# Patient Record
Sex: Female | Born: 1965 | Hispanic: No | Marital: Married | Smoking: Never smoker
Health system: Southern US, Community
[De-identification: ages and names within clinical notes are randomized; demographics above are authoritative.]

## PROBLEM LIST (undated history)

## (undated) DIAGNOSIS — Z789 Other specified health status: Secondary | ICD-10-CM

## (undated) DIAGNOSIS — Z531 Procedure and treatment not carried out because of patient's decision for reasons of belief and group pressure: Secondary | ICD-10-CM

## (undated) DIAGNOSIS — M25561 Pain in right knee: Secondary | ICD-10-CM

## (undated) DIAGNOSIS — IMO0001 Reserved for inherently not codable concepts without codable children: Secondary | ICD-10-CM

## (undated) HISTORY — PX: APPENDECTOMY: SHX54

---

## 2004-03-21 ENCOUNTER — Other Ambulatory Visit: Admission: RE | Admit: 2004-03-21 | Discharge: 2004-03-21 | Payer: Self-pay | Admitting: Obstetrics and Gynecology

## 2004-03-22 ENCOUNTER — Other Ambulatory Visit: Admission: RE | Admit: 2004-03-22 | Discharge: 2004-03-22 | Payer: Self-pay | Admitting: Obstetrics and Gynecology

## 2004-07-29 ENCOUNTER — Other Ambulatory Visit: Admission: RE | Admit: 2004-07-29 | Discharge: 2004-07-29 | Payer: Self-pay | Admitting: Obstetrics and Gynecology

## 2004-10-17 ENCOUNTER — Inpatient Hospital Stay (HOSPITAL_COMMUNITY): Admission: AD | Admit: 2004-10-17 | Discharge: 2004-10-19 | Payer: Self-pay | Admitting: Obstetrics & Gynecology

## 2004-11-18 ENCOUNTER — Other Ambulatory Visit: Admission: RE | Admit: 2004-11-18 | Discharge: 2004-11-18 | Payer: Self-pay | Admitting: Obstetrics & Gynecology

## 2016-04-12 ENCOUNTER — Emergency Department (HOSPITAL_COMMUNITY): Payer: BLUE CROSS/BLUE SHIELD

## 2016-04-12 ENCOUNTER — Encounter (HOSPITAL_COMMUNITY): Payer: Self-pay

## 2016-04-12 ENCOUNTER — Emergency Department (HOSPITAL_COMMUNITY)
Admission: EM | Admit: 2016-04-12 | Discharge: 2016-04-12 | Disposition: A | Discharge status: Home / Self Care | Payer: BLUE CROSS/BLUE SHIELD | Attending: Physician Assistant | Admitting: Physician Assistant

## 2016-04-12 DIAGNOSIS — R079 Chest pain, unspecified: Secondary | ICD-10-CM

## 2016-04-12 DIAGNOSIS — W11XXXA Fall on and from ladder, initial encounter: Secondary | ICD-10-CM | POA: Insufficient documentation

## 2016-04-12 DIAGNOSIS — Y9389 Activity, other specified: Secondary | ICD-10-CM | POA: Diagnosis not present

## 2016-04-12 DIAGNOSIS — Y999 Unspecified external cause status: Secondary | ICD-10-CM | POA: Diagnosis not present

## 2016-04-12 DIAGNOSIS — Y929 Unspecified place or not applicable: Secondary | ICD-10-CM | POA: Insufficient documentation

## 2016-04-12 DIAGNOSIS — R0789 Other chest pain: Secondary | ICD-10-CM | POA: Diagnosis not present

## 2016-04-12 LAB — COMPREHENSIVE METABOLIC PANEL
ALBUMIN: 3.2 g/dL — AB (ref 3.5–5.0)
ALT: 24 U/L (ref 14–54)
ANION GAP: 10 (ref 5–15)
AST: 22 U/L (ref 15–41)
Alkaline Phosphatase: 78 U/L (ref 38–126)
BILIRUBIN TOTAL: 0.4 mg/dL (ref 0.3–1.2)
BUN: 7 mg/dL (ref 6–20)
CHLORIDE: 106 mmol/L (ref 101–111)
CO2: 24 mmol/L (ref 22–32)
Calcium: 9.3 mg/dL (ref 8.9–10.3)
Creatinine, Ser: 0.6 mg/dL (ref 0.44–1.00)
GFR calc Af Amer: >60 mL/min (ref >60)
GFR calc non Af Amer: >60 mL/min (ref >60)
GLUCOSE: 103 mg/dL — AB (ref 65–99)
POTASSIUM: 3.5 mmol/L (ref 3.5–5.1)
SODIUM: 140 mmol/L (ref 135–145)
TOTAL PROTEIN: 7.1 g/dL (ref 6.5–8.1)

## 2016-04-12 LAB — CBC WITH DIFFERENTIAL/PLATELET
BASOS ABS: 0.1 10*3/uL (ref 0.0–0.1)
Basophils Relative: 1 %
EOS PCT: 1 %
Eosinophils Absolute: 0 10*3/uL (ref 0.0–0.7)
HEMATOCRIT: 36.6 % (ref 36.0–46.0)
Hemoglobin: 12.2 g/dL (ref 12.0–15.0)
LYMPHS ABS: 1.8 10*3/uL (ref 0.7–4.0)
LYMPHS PCT: 30 %
MCH: 27.4 pg (ref 26.0–34.0)
MCHC: 33.3 g/dL (ref 30.0–36.0)
MCV: 82.2 fL (ref 78.0–100.0)
MONO ABS: 0.4 10*3/uL (ref 0.1–1.0)
Monocytes Relative: 7 %
NEUTROS ABS: 3.6 10*3/uL (ref 1.7–7.7)
Neutrophils Relative %: 61 %
Platelets: 259 10*3/uL (ref 150–400)
RBC: 4.45 MIL/uL (ref 3.87–5.11)
RDW: 14.1 % (ref 11.5–15.5)
WBC: 5.9 10*3/uL (ref 4.0–10.5)

## 2016-04-12 LAB — I-STAT TROPONIN, ED
TROPONIN I, POC: 0 ng/mL (ref 0.00–0.08)
Troponin i, poc: 0 ng/mL (ref 0.00–0.08)

## 2016-04-12 MED ORDER — ACETAMINOPHEN 325 MG PO TABS
650.0000 mg | ORAL_TABLET | Freq: Once | ORAL | Status: AC
  Administered 2016-04-12: 650 mg via ORAL
  Filled 2016-04-12: qty 2

## 2016-04-12 NOTE — Discharge Instructions (Signed)
We're happy to report that we do not think you're having a heart attack. Please follow-up with her primary care physician and her cardiologist as an outpatient. Please return with any concerns.

## 2016-04-12 NOTE — ED Triage Notes (Signed)
Patient complains of left anterior chest pain and right arm pain after attempting to catch spouse who fell off ladder yesterday. Pain to chest worse with movement and inspiration, pain to right arm with ROM. No deformity noted, NAD

## 2016-04-12 NOTE — ED Notes (Signed)
EDP at bedside  

## 2016-04-12 NOTE — ED Notes (Signed)
Patient transported to X-ray 

## 2016-04-12 NOTE — ED Provider Notes (Signed)
[facility]-EMERGENCY DEPT Provider Note   CSN: [id number] Arrival date & time: 04/12/16  0844     History   Chief Complaint Chief Complaint  Patient presents with  . Chest Pain    HPI Lori Mcbride is a 50 y.o. female.  The history is provided by the patient.  Chest Pain   This is a new problem. The current episode started yesterday. Episode frequency: once, resolved. The problem has been resolved. Associated with: right after her husbadn fell from a ladder. The quality of the pain is described as brief. The pain does not radiate. Exacerbated by: resolved. Associated symptoms include headaches and shortness of breath. She has tried rest for the symptoms. The treatment provided significant relief. There are no known risk factors.  Pertinent negatives for past medical history include no COPD and no CHF.    Patient's husband fell off a ladder and she was present at the time. He has suffered significant injuries at the time. When he fell she had chest pain did not radiate last a couple minutes and has now disappeared. No chest pain since yesterday.  History reviewed. No pertinent past medical history.  There are no active problems to display for this patient.   History reviewed. No pertinent surgical history.  OB History    No data available       Home Medications    Prior to Admission medications   Not on File    Family History No family history on file.  Social History Social History  Substance Use Topics  . Smoking status: Never Smoker  . Smokeless tobacco: Never Used  . Alcohol use Not on file     Allergies   Review of patient's allergies indicates no known allergies.   Review of Systems Review of Systems  Respiratory: Positive for shortness of breath.   Cardiovascular: Positive for chest pain.  Neurological: Positive for headaches.  All other systems reviewed and are negative.    Physical Exam Updated Vital Signs BP 138/84 (BP Location: Left Arm)    Pulse 85   Temp 98.4 F (36.9 C) (Oral)   Resp 18   Ht 5\' 4"  (1.626 m)   Wt 190 lb (86.2 kg)   SpO2 99%   BMI 32.61 kg/m   Physical Exam  Constitutional: She is oriented to person, place, and time. She appears well-developed and well-nourished.  HENT:  Head: Normocephalic and atraumatic.  Eyes: Pupils are equal, round, and reactive to light. Right eye exhibits no discharge.  Cardiovascular: Normal rate and regular rhythm.   No murmur heard. Pulmonary/Chest: Effort normal. No respiratory distress.  Abdominal: Soft.  Neurological: She is oriented to person, place, and time.  Skin: Skin is warm and dry. She is not diaphoretic.  Psychiatric: She has a normal mood and affect.  Nursing note and vitals reviewed.    ED Treatments / Results  Labs (all labs ordered are listed, but only abnormal results are displayed) Labs Reviewed  COMPREHENSIVE METABOLIC PANEL  CBC WITH DIFFERENTIAL/PLATELET  Rosezena Sensor, ED    EKG  EKG Interpretation  Date/Time:  Saturday April 12 2016 08:51:31 EDT Ventricular Rate:  83 PR Interval:    QRS Duration: 128 QT Interval:  430 QTC Calculation: 505 R Axis:   81 Text Interpretation:  Normal sinus rhythm Non-specific intra-ventricular conduction block Cannot rule out Anterior infarct , age undetermined Abnormal ECG Abnormal ekg Confirmed by Kandis Mannan ([id number]) on 04/12/2016 9:03:18 AM       Radiology No results  found.  Procedures Procedures (including critical care time)  Medications Ordered in ED Medications - No data to display   Initial Impression / Assessment and Plan / ED Course  I have reviewed the triage vital signs and the nursing notes.  Pertinent labs & imaging results that were available during my care of the patient were reviewed by me and considered in my medical decision making (see chart for details).  Clinical Course    Patient a very pleasant 50 year old Hispanic female presenting with chest pain.  Patient had chest pain that lasted a couple minutes and happened yesterday after traumatic event to her husband who fell off a ladder. Patient is not having chest pain since. Patient does have mild right right tenderness to her right wrist after catching her husband. Patient's husband is admitted upstairs with multiple fractures. Given the patient had chest pain yesterday. Patient's of having a tiny bit of chest pain now. Worse with palpitation. We'll do delta troponin. Patient had visit with cardiologist before.  Reportedly they felt that she was fine and did not require intervention. Her EKG shows interventricular delay with negative sgarbossa criteria.  Delta trop negative.  Return precautions expressed, she will follow with cards and PCP.   Patient is comfortable, ambulatory, and taking PO at time of discharge.  Patient expressed understanding about return precautions.    Final Clinical Impressions(s) / ED Diagnoses   Final diagnoses:  None    New Prescriptions New Prescriptions   No medications on file     Mourad Cwikla Randall AnLyn Lonetta Blassingame, MD 04/12/16 1534

## 2016-04-12 NOTE — ED Notes (Signed)
Pt. Wanting medication for headache 4/10. EDP made aware.

## 2017-03-12 DIAGNOSIS — Z1389 Encounter for screening for other disorder: Secondary | ICD-10-CM | POA: Diagnosis not present

## 2017-03-12 DIAGNOSIS — Z1231 Encounter for screening mammogram for malignant neoplasm of breast: Secondary | ICD-10-CM | POA: Diagnosis not present

## 2017-03-12 DIAGNOSIS — Z0000 Encounter for general adult medical examination without abnormal findings: Secondary | ICD-10-CM | POA: Diagnosis not present

## 2019-12-20 DIAGNOSIS — K922 Gastrointestinal hemorrhage, unspecified: Secondary | ICD-10-CM

## 2019-12-20 HISTORY — DX: Gastrointestinal hemorrhage, unspecified: K92.2

## 2020-01-11 ENCOUNTER — Observation Stay (HOSPITAL_COMMUNITY): Payer: 59 | Admitting: Certified Registered"

## 2020-01-11 ENCOUNTER — Encounter (HOSPITAL_COMMUNITY): Payer: Self-pay

## 2020-01-11 ENCOUNTER — Other Ambulatory Visit: Payer: Self-pay

## 2020-01-11 ENCOUNTER — Inpatient Hospital Stay (HOSPITAL_COMMUNITY)
Admission: EM | Admit: 2020-01-11 | Discharge: 2020-01-13 | DRG: 378 | Disposition: A | Discharge status: Home / Self Care | Payer: 59 | Attending: Internal Medicine | Admitting: Internal Medicine

## 2020-01-11 ENCOUNTER — Encounter (HOSPITAL_COMMUNITY)
Admission: EM | Disposition: A | Discharge status: Home / Self Care | Payer: Self-pay | Source: Home / Self Care | Attending: Internal Medicine

## 2020-01-11 DIAGNOSIS — R739 Hyperglycemia, unspecified: Secondary | ICD-10-CM | POA: Diagnosis present

## 2020-01-11 DIAGNOSIS — K254 Chronic or unspecified gastric ulcer with hemorrhage: Principal | ICD-10-CM | POA: Diagnosis present

## 2020-01-11 DIAGNOSIS — K3182 Dieulafoy lesion (hemorrhagic) of stomach and duodenum: Secondary | ICD-10-CM | POA: Diagnosis present

## 2020-01-11 DIAGNOSIS — K922 Gastrointestinal hemorrhage, unspecified: Secondary | ICD-10-CM | POA: Diagnosis not present

## 2020-01-11 DIAGNOSIS — Z531 Procedure and treatment not carried out because of patient's decision for reasons of belief and group pressure: Secondary | ICD-10-CM | POA: Diagnosis present

## 2020-01-11 DIAGNOSIS — Z20822 Contact with and (suspected) exposure to covid-19: Secondary | ICD-10-CM | POA: Diagnosis present

## 2020-01-11 DIAGNOSIS — D62 Acute posthemorrhagic anemia: Secondary | ICD-10-CM | POA: Diagnosis present

## 2020-01-11 HISTORY — DX: Pain in right knee: M25.561

## 2020-01-11 HISTORY — PX: HEMOSTASIS CLIP PLACEMENT: SHX6857

## 2020-01-11 HISTORY — DX: Other specified health status: Z78.9

## 2020-01-11 HISTORY — PX: ESOPHAGOGASTRODUODENOSCOPY: SHX5428

## 2020-01-11 HISTORY — DX: Reserved for inherently not codable concepts without codable children: IMO0001

## 2020-01-11 HISTORY — DX: Procedure and treatment not carried out because of patient's decision for reasons of belief and group pressure: Z53.1

## 2020-01-11 HISTORY — PX: HEMOSTASIS CONTROL: SHX6838

## 2020-01-11 LAB — I-STAT BETA HCG BLOOD, ED (MC, WL, AP ONLY): I-stat hCG, quantitative: <5 m[IU]/mL (ref <5)

## 2020-01-11 LAB — COMPREHENSIVE METABOLIC PANEL
ALT: 16 U/L (ref 0–44)
AST: 20 U/L (ref 15–41)
Albumin: 3 g/dL — ABNORMAL LOW (ref 3.5–5.0)
Alkaline Phosphatase: 53 U/L (ref 38–126)
Anion gap: 9 (ref 5–15)
BUN: 7 mg/dL (ref 6–20)
CO2: 21 mmol/L — ABNORMAL LOW (ref 22–32)
Calcium: 8.4 mg/dL — ABNORMAL LOW (ref 8.9–10.3)
Chloride: 109 mmol/L (ref 98–111)
Creatinine, Ser: 0.64 mg/dL (ref 0.44–1.00)
GFR calc Af Amer: >60 mL/min (ref >60)
GFR calc non Af Amer: >60 mL/min (ref >60)
Glucose, Bld: 123 mg/dL — ABNORMAL HIGH (ref 70–99)
Potassium: 3.4 mmol/L — ABNORMAL LOW (ref 3.5–5.1)
Sodium: 139 mmol/L (ref 135–145)
Total Bilirubin: 0.8 mg/dL (ref 0.3–1.2)
Total Protein: 5.4 g/dL — ABNORMAL LOW (ref 6.5–8.1)

## 2020-01-11 LAB — CBC
HCT: 19.8 % — ABNORMAL LOW (ref 36.0–46.0)
HCT: 17.6 % — ABNORMAL LOW (ref 36.0–46.0)
Hemoglobin: 6.4 g/dL — CL (ref 12.0–15.0)
Hemoglobin: 5.6 g/dL — CL (ref 12.0–15.0)
MCH: 28.4 pg (ref 26.0–34.0)
MCH: 27.6 pg (ref 26.0–34.0)
MCHC: 32.3 g/dL (ref 30.0–36.0)
MCHC: 31.8 g/dL (ref 30.0–36.0)
MCV: 88 fL (ref 80.0–100.0)
MCV: 86.7 fL (ref 80.0–100.0)
Platelets: 229 10*3/uL (ref 150–400)
Platelets: 211 10*3/uL (ref 150–400)
RBC: 2.25 MIL/uL — ABNORMAL LOW (ref 3.87–5.11)
RBC: 2.03 MIL/uL — ABNORMAL LOW (ref 3.87–5.11)
RDW: 15.1 % (ref 11.5–15.5)
RDW: 15.5 % (ref 11.5–15.5)
WBC: 8.9 10*3/uL (ref 4.0–10.5)
WBC: 7.5 10*3/uL (ref 4.0–10.5)
nRBC: 0 % (ref 0.0–0.2)
nRBC: 0.5 % — ABNORMAL HIGH (ref 0.0–0.2)

## 2020-01-11 LAB — PROTIME-INR
INR: 1.1 (ref 0.8–1.2)
Prothrombin Time: 13.6 seconds (ref 11.4–15.2)

## 2020-01-11 LAB — URINALYSIS, ROUTINE W REFLEX MICROSCOPIC
Bilirubin Urine: NEGATIVE
Glucose, UA: NEGATIVE mg/dL
Hgb urine dipstick: NEGATIVE
Ketones, ur: NEGATIVE mg/dL
Leukocytes,Ua: NEGATIVE
Nitrite: NEGATIVE
Protein, ur: NEGATIVE mg/dL
Specific Gravity, Urine: 1.01 (ref 1.005–1.030)
pH: 6 (ref 5.0–8.0)

## 2020-01-11 LAB — SARS CORONAVIRUS 2 BY RT PCR (HOSPITAL ORDER, PERFORMED IN ~~LOC~~ HOSPITAL LAB): SARS Coronavirus 2: NEGATIVE

## 2020-01-11 LAB — HIV ANTIBODY (ROUTINE TESTING W REFLEX): HIV Screen 4th Generation wRfx: NONREACTIVE

## 2020-01-11 SURGERY — EGD (ESOPHAGOGASTRODUODENOSCOPY)
Anesthesia: Monitor Anesthesia Care

## 2020-01-11 MED ORDER — PROPOFOL 500 MG/50ML IV EMUL
INTRAVENOUS | Status: DC | PRN
  Administered 2020-01-11: 100 ug/kg/min via INTRAVENOUS

## 2020-01-11 MED ORDER — SODIUM CHLORIDE 0.9 % IV SOLN: 80.0000 mg | Freq: Once | INTRAVENOUS | Status: DC

## 2020-01-11 MED ORDER — PANTOPRAZOLE SODIUM 40 MG IV SOLR
40.0000 mg | Freq: Once | INTRAVENOUS | Status: AC
  Administered 2020-01-11: 40 mg via INTRAVENOUS
  Filled 2020-01-11: qty 40

## 2020-01-11 MED ORDER — SODIUM CHLORIDE 0.9 % IV SOLN
8.0000 mg/h | INTRAVENOUS | Status: DC
  Administered 2020-01-11 – 2020-01-13 (×6): 8 mg/h via INTRAVENOUS
  Filled 2020-01-11 (×6): qty 80

## 2020-01-11 MED ORDER — ACETAMINOPHEN 325 MG PO TABS: 650.0000 mg | ORAL_TABLET | Freq: Four times a day (QID) | ORAL | Status: DC | PRN

## 2020-01-11 MED ORDER — SODIUM CHLORIDE 0.9 % IV SOLN
80.0000 mg | Freq: Once | INTRAVENOUS | Status: DC
  Filled 2020-01-11: qty 80

## 2020-01-11 MED ORDER — SODIUM CHLORIDE (PF) 0.9 % IJ SOLN
PREFILLED_SYRINGE | INTRAMUSCULAR | Status: DC | PRN
  Administered 2020-01-11: 3 mL

## 2020-01-11 MED ORDER — ACETAMINOPHEN 650 MG RE SUPP: 650.0000 mg | Freq: Four times a day (QID) | RECTAL | Status: DC | PRN

## 2020-01-11 MED ORDER — OCTREOTIDE LOAD VIA INFUSION
50.0000 ug | Freq: Once | INTRAVENOUS | Status: AC
  Administered 2020-01-11: 50 ug via INTRAVENOUS
  Filled 2020-01-11: qty 25

## 2020-01-11 MED ORDER — SODIUM CHLORIDE 0.9 % IV SOLN
50.0000 ug/h | INTRAVENOUS | Status: DC
  Administered 2020-01-11: 50 ug/h via INTRAVENOUS
  Filled 2020-01-11: qty 1

## 2020-01-11 MED ORDER — PANTOPRAZOLE SODIUM 40 MG IV SOLR
8.0000 mg/h | INTRAVENOUS | Status: DC
  Filled 2020-01-11: qty 80

## 2020-01-11 MED ORDER — LACTATED RINGERS IV SOLN: INTRAVENOUS | Status: DC

## 2020-01-11 MED ORDER — ONDANSETRON HCL 4 MG PO TABS: 4.0000 mg | ORAL_TABLET | Freq: Four times a day (QID) | ORAL | Status: DC | PRN

## 2020-01-11 MED ORDER — SODIUM CHLORIDE 0.9% FLUSH
3.0000 mL | Freq: Two times a day (BID) | INTRAVENOUS | Status: DC
  Administered 2020-01-12 – 2020-01-13 (×2): 3 mL via INTRAVENOUS

## 2020-01-11 MED ORDER — EPINEPHRINE 1 MG/10ML IJ SOSY
PREFILLED_SYRINGE | INTRAMUSCULAR | Status: AC
  Filled 2020-01-11: qty 10

## 2020-01-11 MED ORDER — LACTATED RINGERS IV BOLUS
500.0000 mL | Freq: Once | INTRAVENOUS | Status: AC
  Administered 2020-01-11: 500 mL via INTRAVENOUS

## 2020-01-11 MED ORDER — ONDANSETRON HCL 4 MG/2ML IJ SOLN: 4.0000 mg | Freq: Four times a day (QID) | INTRAMUSCULAR | Status: DC | PRN

## 2020-01-11 NOTE — Anesthesia Postprocedure Evaluation (Signed)
Anesthesia Post Note  Patient: Lori Mcbride  Procedure(s) Performed: ESOPHAGOGASTRODUODENOSCOPY (EGD) (N/A ) HEMOSTASIS CLIP PLACEMENT HEMOSTASIS CONTROL     Patient location during evaluation: PACU Anesthesia Type: MAC Level of consciousness: awake and alert, awake and oriented Pain management: pain level controlled Vital Signs Assessment: post-procedure vital signs reviewed and stable Respiratory status: spontaneous breathing, nonlabored ventilation and respiratory function stable Cardiovascular status: stable and blood pressure returned to baseline Postop Assessment: no apparent nausea or vomiting Anesthetic complications: no   No complications documented.  Last Vitals:  Vitals:   01/11/20 1625 01/11/20 1634  BP: (!) 147/64 132/68  Pulse: (!) 109 100  Resp: (!) 27 (!) 23  Temp:    SpO2: 97% 96%    Last Pain:  Vitals:   01/11/20 1634  TempSrc:   PainSc: 0-No pain                 Catalina Gravel

## 2020-01-11 NOTE — ED Provider Notes (Signed)
Medical screening examination/treatment/procedure(s) were conducted as a shared visit with non-physician practitioner(s) and myself.  I personally evaluated the patient during the encounter.    Patient has no prior history of GI bleed.  She reports she started having some dark looking stool for couple of days.  Early in the morning hours she had gotten out of bed and passed out then vomited bright red blood.  Patient denies alcohol use no frequent use of NSAIDs, no history of liver disease.  Patient considers self otherwise healthy.  Patient is Jehovah's Witness and refuses blood transfusion.  Patient is alert and appropriate.  Mental status clear.  No respiratory distress.  All movements coordinated purposeful symmetric.  Pale in appearance.  Patient came from [facility] with associated records.  Records reviewed.  Patient's hemoglobin is critically low at 5 mg/dL at [facility].  Repeat hemoglobin in the emergency department 6.4.  Patient denies she has had any further episodes of vomiting.  She has not passed any additional dark or cranberry colored stool.  He denies she is having pain.  No clear risk factors for GI bleed.  History consistent with upper GI bleed.  Patient had gotten a Protonix IV dose and iron infusion at [place].  IV Protonix continued and Sandostatin administered.  Patient resuscitated with lactated Ringer.  She has remained clinically stable.  Alert with clear mental status and no significant hypotension at rest.  I will tachycardia.  PA-C Allena Katz has made consultations to gastroenterology.  I have made consultation to hematology.  Agree that onset of action of IV iron and erythropoietin is 24 to 48 hours at minimum.  No other high recommendation blood product substitutes for PRBC in setting of acute GI bleed.  We will continue resuscitation and stabilization.  I spent extensive time reviewing with the patient and her family the risk of possible death from exsanguination  in the event that she had recurrence of bleeding at already critical low hemoglobin level.  I also spoke on the phone with her religious mentors.  They all agree and understand the potential risk and wish all care provided except blood transfusion.  CRITICAL CARE Performed by: Arby Barrette   Total critical care time: 40 minutes  Critical care time was exclusive of separately billable procedures and treating other patients.  Critical care was necessary to treat or prevent imminent or life-threatening deterioration.  Critical care was time spent personally by me on the following activities: development of treatment plan with patient and/or surrogate as well as nursing, discussions with consultants, evaluation of patient's response to treatment, examination of patient, obtaining history from patient or surrogate, ordering and performing treatments and interventions, ordering and review of laboratory studies, ordering and review of radiographic studies, pulse oximetry and re-evaluation of patient's condition.     Arby Barrette, MD 01/14/20 1511

## 2020-01-11 NOTE — Progress Notes (Signed)
Report called to room 6N room3.

## 2020-01-11 NOTE — Op Note (Signed)
[facility] Patient Name: Lori Mcbride Procedure Date : 01/11/2020 MRN: [id number] Attending MD: Wonda Horner , MD Date of Birth: 09-29-1965 CSN: [id number] Age: 54 Admit Type: Inpatient Procedure:                Upper GI endoscopy Indications:              Coffee-ground emesis, Melena Providers:                Wonda Horner, MD, Josie Dixon, RN, Theodora Blow, Technician Referring MD:              Medicines:                Propofol per Anesthesia Complications:            No immediate complications. Estimated Blood Loss:     Estimated blood loss was minimal. Procedure:                Pre-Anesthesia Assessment:                           - Prior to the procedure, a History and Physical                            was performed, and patient medications and                            allergies were reviewed. The patient's tolerance of                            previous anesthesia was also reviewed. The risks                            and benefits of the procedure and the sedation                            options and risks were discussed with the patient.                            All questions were answered, and informed consent                            was obtained. Prior Anticoagulants: The patient has                            taken no previous anticoagulant or antiplatelet                            agents. ASA Grade Assessment: II - A patient with                            mild systemic disease. After reviewing the risks  and benefits, the patient was deemed in                            satisfactory condition to undergo the procedure.                           After obtaining informed consent, the endoscope was                            passed under direct vision. Throughout the                            procedure, the patient's blood pressure, pulse, and                            oxygen saturations  were monitored continuously. The                            GIF-H190 ([id number]) Olympus gastroscope was                            introduced through the mouth, and advanced to the                            duodenal bulb. The upper GI endoscopy was                            accomplished without difficulty. The patient                            tolerated the procedure well. Scope In: Scope Out: Findings:      The examined esophagus was normal.      One oozing cratered gastric ulcer with a visible vessel was found on the       greater curvature of the stomach. The lesion was 7 mm in largest       dimension. Area was successfully injected with 2 mL of a 1:10,000       solution of epinephrine for hemostasis. To stop active bleeding, two       hemostatic clips were successfully placed. One other clip misfired.       There was no bleeding at the end of the procedure.      Two possible Dieulafoy lesions with oozing bleeding and were found in       the stomach. Areas were successfully injected with 2 mL of a 1:10,000       solution of epinephrine for hemostasis.      The examined duodenum was normal. Impression:               - Normal esophagus.                           - Oozing gastric ulcer with a visible vessel.                            Injected. Clips were placed.                           -  Dieulafoy lesion of stomach.                           - Normal examined duodenum.                           - No specimens collected. Recommendation:           - Clear liquid diet.                           - Continue present medications.                           - Observe for further bleeding. Procedure Code(s):        --- Professional ---                           [phone], Esophagogastroduodenoscopy, flexible,                            transoral; with control of bleeding, any method Diagnosis Code(s):        --- Professional ---                           K25.4, Chronic or unspecified gastric  ulcer with                            hemorrhage                           K31.82, Dieulafoy lesion (hemorrhagic) of stomach                            and duodenum                           K92.0, Hematemesis                           K92.1, Melena (includes Hematochezia) CPT copyright 2019 American Medical Association. All rights reserved. The codes documented in this report are preliminary and upon coder review may  be revised to meet current compliance requirements. Graylin Shiver, MD 01/11/2020 4:22:33 PM This report has been signed electronically. Number of Addenda: 0

## 2020-01-11 NOTE — Progress Notes (Signed)
   01/11/20 1634  Assess: MEWS Score  BP 132/68  Pulse Rate 100  ECG Heart Rate (!) 102  Resp (!) 23  Level of Consciousness Alert  SpO2 96 %  O2 Device Room Air  Assess: MEWS Score  MEWS Temp 0  MEWS Systolic 0  MEWS Pulse 1  MEWS RR 1  MEWS LOC 0  MEWS Score 2  MEWS Score Color Yellow  Assess: if the MEWS score is Yellow or Red  Were vital signs taken at a resting state? Yes  Focused Assessment Documented focused assessment  Early Detection of Sepsis Score *See Row Information* Low  MEWS guidelines implemented *See Row Information* Yes  Treat  MEWS Interventions Escalated (See documentation below)  Take Vital Signs  Increase Vital Sign Frequency  Yellow: Q 2hr X 2 then Q 4hr X 2, if remains yellow, continue Q 4hrs  Escalate  MEWS: Escalate Yellow: discuss with charge nurse/RN and consider discussing with provider and RRT  Notify: Charge Nurse/RN  Name of Charge Nurse/RN Notified Kristi, RN  Date Charge Nurse/RN Notified 01/11/20  Time Charge Nurse/RN Notified 1800  Notify: Provider  Provider Drucilla Chalet, MD  Date Provider Notified 01/11/20  Time Provider Notified 1800  Notification Type Page  Notification Reason Change in status  Document  Patient Outcome Other (Comment)  Progress note created (see row info) Yes   Yellow MEWS initiated for patient elevated RR and HR. Patient is A+O times 3. NAD. MD notified, awaiting response.

## 2020-01-11 NOTE — H&P (Addendum)
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  Sunday and Monday she had very dark stools (solid).  Tuesday about 0400, she passed out while going to the bathroom.  She was in the floor for 10-15 minutes and then she had abdominal pain and vomited significant blood and dark colored substance.  An hour later, she passed out again.  She called 911 and was taken to [facility].  She had was sounds like a CT, "they figure out that it's a stomach problem."  They did not have GI there and so she signed out AMA and drove herself here.  Midepigastric pain.  No medications but takes something for inflammation [place].      ED Course: UGI bleed - to EGD at 1230.  Lost consciousness yesterday, melena.  Went to [facility], sent here due to no GI.  Jehovah's Witness, refusing blood.  Hgb 6.4.  Review of Systems: As per HPI; otherwise review of systems reviewed and negative.   Ambulatory Status:  Ambulates without assistance  COVID Vaccine Status:   Complete  Past Medical History:  Diagnosis Date  . Knee pain, right   . Refusal of blood transfusions as patient is Jehovah's Witness     Past Surgical History:  Procedure Laterality Date  . APPENDECTOMY      Social History   Socioeconomic History  . Marital status: Married    Spouse name: Not on file  . Number of children: Not on file  . Years of education: Not on file  . Highest education level: Not on file  Occupational History  . Occupation: housekeeper  Tobacco Use  . Smoking status: Never Smoker  . Smokeless tobacco: Never Used  Substance and Sexual Activity  . Alcohol use: Never  . Drug use: Never  . Sexual  activity: Not on file  Other Topics Concern  . Not on file  Social History Narrative  . Not on file   Social Determinants of Health   Financial Resource Strain:   . Difficulty of Paying Living Expenses:   Food Insecurity:   . Worried About Programme researcher, broadcasting/film/video in the Last Year:   . Barista in the Last Year:   Transportation Needs:   . Freight forwarder (Medical):   Marland Kitchen Lack of Transportation (Non-Medical):   Physical Activity:   . Days of Exercise per Week:   . Minutes of Exercise per Session:   Stress:   . Feeling of Stress :   Social Connections:   . Frequency of Communication with Friends and Family:   . Frequency of Social Gatherings with Friends and Family:   . Attends Religious Services:   . Active Member of Clubs or Organizations:   . Attends Banker Meetings:   Marland Kitchen Marital Status:   Intimate Partner Violence:   . Fear of Current or Ex-Partner:   . Emotionally Abused:   Marland Kitchen Physically Abused:   . Sexually Abused:     No Known Allergies  History reviewed. No pertinent family history.  Prior to Admission medications   Not on File    Physical Exam: Vitals:   01/11/20 0945 01/11/20 1000  01/11/20 1030 01/11/20 1315  BP: 122/72 115/79 122/71 119/69  Pulse: 95 (!) 108 (!) 108 (!) 110  Resp: 19 (!) 23 18 (!) 29  Temp:      TempSrc:      SpO2: 100% 100% 100% 100%  Weight:      Height:         . General:  Appears calm and comfortable and is NAD . Eyes:  PERRL, EOMI, normal lids, iris . ENT:  grossly normal hearing, lips & tongue, mmm . Neck:  no LAD, masses or thyromegaly . Cardiovascular:  RRR, no r/g, 2-3/6 systolic murmur. No LE edema.  Marland Kitchen Respiratory:   CTA bilaterally with no wheezes/rales/rhonchi.  Normal respiratory effort. . Abdomen:  soft, NT, ND, NABS . Skin:  no rash or induration seen on limited exam . Musculoskeletal:  grossly normal tone BUE/BLE, good ROM, no bony abnormality . Lower extremity:  No LE edema.  Limited foot  exam with no ulcerations.  2+ distal pulses. Marland Kitchen Psychiatric:  grossly normal mood and affect, speech fluent and appropriate, AOx3 . Neurologic:  CN 2-12 grossly intact, moves all extremities in coordinated fashion    Radiological Exams on Admission: No results found.  EKG: not done   Labs on Admission: I have personally reviewed the available labs and imaging studies at the time of the admission.  Pertinent labs:   Glucose 123 Albumin 3.0 WBC 8.9 Hgb 6.4 INR 1.1 HCG negative UA WNL COVID negative   Assessment/Plan Principal Problem:   Acute upper GI bleed Active Problems:   Hyperglycemia   Upper GI bleed -Patient started with melena about 4 days ago and had at least 2 episodes of syncope -She was seen at [facility] yesterday and signed out AMA since she was going to require transfer to a facility with GI available and no facilities had beds available -Her husband then drove her here -Most likely diagnoses are gastric or duodenal ulcer vs. Esophagitis/gastritis/duodenitis -She reports taking a ?[facility] anti-inflammatory medication but isn't sure of the name -The situation is complicated by her Jehovah's Witness faith and inability to have blood products -The patient is currently mildly tachycardic with soft blood pressure, suggesting acute volume loss.  -Will observe on telemetry -GI consulted by ED, will follow up recommendations -NPO for possible EGD -LR at 50 mL/hr -Start IV pantoprazole 40 mg drip -GI also started octreotide -Zofran IV for nausea -Avoid NSAIDs and SQ heparin -Maintain IV access (2 large bore IVs if possible). -Monitor closely and follow cbc, but be judicious in drawing blood since patient cannot be transfused  Hyperglycemia -May be stress response -Will follow with fasting AM labs for now    Note: This patient has been tested and is negative for the novel coronavirus COVID-19.  DVT prophylaxis:  SCDs Code Status:  Full - confirmed with  patient/family Family Communication: Son was present Disposition Plan:  The patient is from: home  Anticipated d/c is to: home without [facility] services   Anticipated d/c date will depend on clinical response to treatment, but possibly as early as tomorrow if she has excellent response to treatment  Patient is currently: acutely ill Consults called: GI Admission status:  It is my clinical opinion that referral for OBSERVATION is reasonable and necessary in this patient based on the above information provided. The aforementioned taken together are felt to place the patient at high risk for further clinical deterioration. However it is anticipated that the patient may be medically stable for discharge from  the hospital within 24 to 48 hours.    Karmen Bongo MD [facility]   How to contact the [facility] Attending or Consulting provider [place] or covering provider during after hours [place], for this patient?  1. Check the care team in [facility] and look for a) attending/consulting [facility] provider listed and b) the [facility] team listed 2. Log into [email or web address] and use [place]'s universal password to access. If you do not have the password, please contact the hospital operator. 3. Locate the [facility] provider you are looking for under [facility] and page to a number that you can be directly reached. 4. If you still have difficulty reaching the provider, please page the [facility] (Director on Call) for the Hospitalists listed on amion for assistance.   01/11/2020, 2:37 PM

## 2020-01-11 NOTE — ED Triage Notes (Signed)
Pt reports that she was at had several syncopal episodes over the past few days, went to [facility], has lab test, has a HBG of 5.5, sent here because they do not GI. Pt reports that she has been vomiting blood and has it in her stool, dark red. Pt is a Jehovah witness and states she refuses blood and is requesting Epo

## 2020-01-11 NOTE — Consult Note (Addendum)
Referring Provider: Farrel Gordon, PA-C (ED) Primary Care Physician:  Zachery Dakins, MD Primary Gastroenterologist:  Gentry Fitz  Reason for Consultation: Upper GI Bleeding  HPI: Lori Mcbride is a 54 y.o. 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  Patient was in her usual state of health until Sunday 6/20 when she had a formed, dark black stool.  She has continued to have 1 formed black bowel movement daily since that time.  She has also been feeling fatigued.  She presented to [facility] ED and reports having one episode of hematemesis on Tuesday 6/22 with dark red blood and black material in the emesis.  She also reports two syncopal episodes Tuesday 6/22.  She also reports some upper abdominal pain yesterday but states this has since resolved.  She denies any heartburn, dysphagia, changes in appetite, unexplained weight loss, hematochezia, constipation, and diarrhea.  Denies any known medical conditions.  Patient denies alcohol use.  She denies taking any NSAIDs, aspirin, or blood thinners.  She states she takes an OTC herbal product that she buys from [facility] that is a liquid for her right knee pain called "Flex" (?glucosamine).  However, she does not know the name or ingredients of this product.  Denies any family history of gastrointestinal malignancies or colon cancer.  She has never had an EGD or colonoscopy.  Only prior surgery is an appendectomy.  Past Medical History:  Diagnosis Date  . Knee pain, right   . Refusal of blood transfusions as patient is Jehovah's Witness     Past Surgical History:  Procedure Laterality Date  . APPENDECTOMY      Prior to Admission medications   Not on File    Scheduled Meds: . octreotide  50 mcg Intravenous Once   Continuous Infusions: . lactated ringers    . octreotide  (SANDOSTATIN)    IV infusion    . pantoprozole  (PROTONIX) infusion 8 mg/hr (01/11/20 1030)   PRN Meds:.  Allergies as of 01/11/2020  . (No Known Allergies)    History reviewed. No pertinent family history.  Social History   Socioeconomic History  . Marital status: Married    Spouse name: Not on file  . Number of children: Not on file  . Years of education: Not on file  . Highest education level: Not on file  Occupational History  . Occupation: housekeeper  Tobacco Use  . Smoking status: Never Smoker  . Smokeless tobacco: Never Used  Substance and Sexual Activity  . Alcohol use: Never  . Drug use: Never  . Sexual activity: Not on file  Other Topics Concern  . Not on file  Social History Narrative  . Not on file   Social Determinants of Health   Financial Resource Strain:   . Difficulty of Paying Living Expenses:   Food Insecurity:   . Worried About Programme researcher, broadcasting/film/video in the Last Year:   . Barista in the Last Year:   Transportation Needs:   . Freight forwarder (Medical):   Marland Kitchen Lack of Transportation (Non-Medical):   Physical Activity:   . Days of Exercise per Week:   . Minutes of Exercise per Session:   Stress:   . Feeling of Stress :   Social Connections:   . Frequency of Communication with Friends and Family:   . Frequency of Social Gatherings with Friends and Family:   .  Attends Religious Services:   . Active Member of Clubs or Organizations:   . Attends Archivist Meetings:   Marland Kitchen Marital Status:   Intimate Partner Violence:   . Fear of Current or Ex-Partner:   . Emotionally Abused:   Marland Kitchen Physically Abused:   . Sexually Abused:     Review of Systems: Review of Systems  Constitutional: Positive for malaise/fatigue. Negative for chills, fever and weight loss.  HENT: Negative for hearing loss and tinnitus.   Eyes: Negative for pain and redness.  Respiratory: Negative for cough and shortness of breath.   Cardiovascular: Negative for chest pain and palpitations.  Gastrointestinal:  Positive for melena, nausea and vomiting. Negative for abdominal pain, blood in stool, constipation, diarrhea and heartburn.  Genitourinary: Negative for flank pain and hematuria.  Musculoskeletal: Negative for falls and joint pain.  Skin: Negative for itching and rash.  Neurological: Positive for loss of consciousness. Negative for seizures.  Endo/Heme/Allergies: Negative for polydipsia. Does not bruise/bleed easily.  Psychiatric/Behavioral: Negative for substance abuse. The patient is not nervous/anxious.     Physical Exam: Vital signs: Vitals:   01/11/20 1000 01/11/20 1030  BP: 115/79 122/71  Pulse: (!) 108 (!) 108  Resp: (!) 23 18  Temp:    SpO2: 100% 100%     Physical Exam Constitutional:      General: She is not in acute distress.    Appearance: Normal appearance.  HENT:     Head: Normocephalic and atraumatic.     Mouth/Throat:     Mouth: Mucous membranes are moist.     Pharynx: Oropharynx is clear.  Eyes:     Extraocular Movements: Extraocular movements intact.     Comments: Conjunctival pallor  Cardiovascular:     Rate and Rhythm: Regular rhythm. Tachycardia present.     Pulses: Normal pulses.     Heart sounds: Normal heart sounds.  Pulmonary:     Effort: Pulmonary effort is normal. No respiratory distress.     Breath sounds: Normal breath sounds.  Musculoskeletal:        General: No swelling or tenderness.     Cervical back: Normal range of motion and neck supple.  Skin:    General: Skin is warm and dry.  Neurological:     General: No focal deficit present.     Mental Status: She is alert and oriented to person, place, and time.  Psychiatric:        Mood and Affect: Mood normal.        Behavior: Behavior normal.      GI:  Lab Results: Recent Labs    01/11/20 0354  WBC 8.9  HGB 6.4*  HCT 19.8*  PLT 229   BMET Recent Labs    01/11/20 0354  NA 139  K 3.4*  CL 109  CO2 21*  GLUCOSE 123*  BUN 7  CREATININE 0.64  CALCIUM 8.4*    LFT Recent Labs    01/11/20 0354  PROT 5.4*  ALBUMIN 3.0*  AST 20  ALT 16  ALKPHOS 53  BILITOT 0.8   PT/INR No results for input(s): LABPROT, INR in the last 72 hours.   Studies/Results: No results found.  Impression: Suspected Upper GI Bleeding -Hgb 6.4 today, per chart review had Hgb 5.5 at [facility] ED -BUN 7/Cr 0.64 -PT/INR within normal limits  Jehovah's Witness: refuses blood products but is OK with albumin per discussion.  Patient states she cannot have blood/RBCs, WBCs, platelets, or plasma but can have components  of these products, such as albumin.   Plan: EGD today with Dr. Evette Cristal. The procedure, nature, benefits, and risks (bleeding, infection, perforation, anesthesia/pulmonary or cardiac complications) were reviewed with the patient. Patient verbalized understanding and gave verbal consent to proceed with the EGD with Dr. Evette Cristal today.  Continue Pantoprazole infusion.  Patient will need a screening colonoscopy as an outpatient.  [facility] will follow.   LOS: 0 days   Edrick Kins  PA-C 01/11/2020, 11:45 AM  Contact #  [phone]

## 2020-01-11 NOTE — ED Notes (Signed)
Patient transported to endoscopy. 

## 2020-01-11 NOTE — ED Provider Notes (Signed)
[facility] EMERGENCY DEPARTMENT Provider Note   CSN: [id number] Arrival date & time: 01/11/20  0321     History Chief Complaint  Patient presents with  . Loss of Consciousness  . GI Bleeding    Lori Mcbride is a 54 y.o. female with no pertinent past medical history that presents the emergency department today for GI bleeding.  Patient states that she passed out yesterday with LOC at 4 AM, husband had to wake her up in the middle of the floor.  States that when she woke up she vomited bright red blood for a couple minutes.  States that couple days before she noticed dark tarry stools.  States that she did have abdominal pain at this time, has no more abdominal pain.  States that a couple hours later she passed out again with LOC.  Is not on any blood thinners.  Has never had a GI bleed before.  Denies any abdominal surgeries except for appendix removal when she was very young.  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  Patient was seen in [facility] who sent her here because they do not do GI.   Patient denies drinking, smoking, liver disease, NSAID use.  Patient states that she was fairly healthy before this happened.  No medical issues.  Denies high blood pressure or diabetes.  Denies hitting her head.  Denies fever, URI like symptoms.  Denies any vaginal bleeding, hematuria, dysuria.  Per [facility] faxed paperwork, patient did have a hemoglobin of 7 upon presentation, they repeated it and it was 5.  Did not have any hematemesis since 4 AM, denies hematochezia for over 24 hours.  They state that there was no indication of ongoing bleeding at this time.  They did start Protonix bolus along with IV iron infusion and IV fluids.  They also added folate and B12 supplements.    HPI     Past Medical History:  Diagnosis Date  . Knee pain, right   . Refusal of blood transfusions as patient is Jehovah's Witness      Patient Active Problem List   Diagnosis Date Noted  . Acute upper GI bleed 01/11/2020    Past Surgical History:  Procedure Laterality Date  . APPENDECTOMY       OB History   No obstetric history on file.     History reviewed. No pertinent family history.  Social History   Tobacco Use  . Smoking status: Never Smoker  . Smokeless tobacco: Never Used  Substance Use Topics  . Alcohol use: Never  . Drug use: Never    Home Medications Prior to Admission medications   Not on File    Allergies    Patient has no known allergies.  Review of Systems   Review of Systems  Constitutional: Negative for chills, diaphoresis, fatigue and fever.  HENT: Negative for congestion, sore throat and trouble swallowing.   Eyes: Negative for pain and visual disturbance.  Respiratory: Negative for cough, shortness of breath and wheezing.   Cardiovascular: Negative for chest pain, palpitations and leg swelling.  Gastrointestinal: Positive for abdominal pain, blood in stool, nausea and vomiting. Negative for abdominal distention and diarrhea.  Genitourinary: Negative for difficulty urinating, flank pain, frequency and genital sores.  Musculoskeletal: Negative for back pain, neck pain and neck stiffness.  Skin: Negative for pallor.  Neurological: Negative for dizziness, speech difficulty, weakness and headaches.  Psychiatric/Behavioral: Negative for confusion.  Physical Exam Updated Vital Signs BP 119/69   Pulse (!) 110   Temp 98.4 F (36.9 C) (Oral)   Resp (!) 29   Ht 5\' 4"  (1.626 m)   Wt 77.1 kg   SpO2 100%   BMI 29.18 kg/m   Physical Exam Constitutional:      General: She is not in acute distress.    Appearance: Normal appearance. She is not ill-appearing, toxic-appearing or diaphoretic.     Comments: Patient is stable, resting comfortably in bed.  Patient is not complaining of any pain.  Patient is not in any acute respiratory distress, is not ill-appearing.  HENT:      Head: Normocephalic and atraumatic.     Mouth/Throat:     Mouth: Mucous membranes are moist.     Pharynx: Oropharynx is clear.  Eyes:     General: Scleral icterus present.     Extraocular Movements: Extraocular movements intact.     Pupils: Pupils are equal, round, and reactive to light.  Cardiovascular:     Rate and Rhythm: Normal rate and regular rhythm.     Pulses: Normal pulses.     Heart sounds: Normal heart sounds.  Pulmonary:     Effort: Pulmonary effort is normal. No respiratory distress.     Breath sounds: Normal breath sounds. No stridor. No wheezing, rhonchi or rales.  Chest:     Chest wall: No tenderness.  Abdominal:     General: Abdomen is flat. There is no distension.     Palpations: Abdomen is soft.     Tenderness: There is no abdominal tenderness. There is no guarding or rebound.  Musculoskeletal:        General: No swelling or tenderness. Normal range of motion.     Cervical back: Normal range of motion and neck supple. No rigidity.     Right lower leg: No edema.     Left lower leg: No edema.  Skin:    General: Skin is warm and dry.     Capillary Refill: Capillary refill takes less than 2 seconds.     Coloration: Skin is not pale.  Neurological:     General: No focal deficit present.     Mental Status: She is alert and oriented to person, place, and time.     Cranial Nerves: No cranial nerve deficit.     Sensory: No sensory deficit.     Motor: No weakness.     Coordination: Coordination normal.     Gait: Gait normal.  Psychiatric:        Mood and Affect: Mood normal.        Behavior: Behavior normal.     ED Results / Procedures / Treatments   Labs (all labs ordered are listed, but only abnormal results are displayed) Labs Reviewed  COMPREHENSIVE METABOLIC PANEL - Abnormal; Notable for the following components:      Result Value   Potassium 3.4 (*)    CO2 21 (*)    Glucose, Bld 123 (*)    Calcium 8.4 (*)    Total Protein 5.4 (*)    Albumin 3.0  (*)    All other components within normal limits  CBC - Abnormal; Notable for the following components:   RBC 2.25 (*)    Hemoglobin 6.4 (*)    HCT 19.8 (*)    All other components within normal limits  URINALYSIS, ROUTINE W REFLEX MICROSCOPIC - Abnormal; Notable for the following components:   Color, Urine STRAW (*)  All other components within normal limits  SARS CORONAVIRUS 2 BY RT PCR (HOSPITAL ORDER, [place] [facility])  PROTIME-INR  I-STAT BETA HCG BLOOD, ED ([facility], [facility], [facility] ONLY)  POC OCCULT BLOOD, ED    EKG None  Radiology No results found.  Procedures Procedures (including critical care time)  Medications Ordered in ED Medications  lactated ringers infusion ( Intravenous New Bag/Given 01/11/20 1222)  octreotide (SANDOSTATIN) 2 mcg/mL load via infusion 50 mcg (50 mcg Intravenous Bolus from Bag 01/11/20 1226)    And  octreotide (SANDOSTATIN) 500 mcg in sodium chloride 0.9 % 250 mL (2 mcg/mL) infusion (50 mcg/hr Intravenous New Bag/Given 01/11/20 1229)  pantoprazole (PROTONIX) 80 mg in sodium chloride 0.9 % 100 mL (0.8 mg/mL) infusion (8 mg/hr Intravenous New Bag/Given 01/11/20 1030)  lactated ringers bolus 500 mL (0 mLs Intravenous Stopped 01/11/20 1018)  pantoprazole (PROTONIX) injection 40 mg (40 mg Intravenous Given 01/11/20 1035)    ED Course  I have reviewed the triage vital signs and the nursing notes.  Pertinent labs & imaging results that were available during my care of the patient were reviewed by me and considered in my medical decision making (see chart for details).    MDM Rules/Calculators/A&P                         Jamyah Folk is a 54 y.o. female with no pertinent past medical history that presents the emergency department today for upper GI bleeding. 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 Patient is stable in the ER currently, mildly tachycardiac to 110.  Blood pressure is stable.  No abdominal pain this time.   Will initiate IV Protonix, IV octreotide, fluids. Did speak to Dr. Vallery Ridge about this.  Will consult GI and hospitalist at this time. 1044 spoke to Dr. Penelope Coop, GI who states that he will come down to see the patient.  EGD scheduled for 430 this afternoon. 112 .discussed case with hospitalist, Dr. Lorin Mercy who agrees to accept care of patient.  The patient appears reasonably stabilized for admission considering the current resources, flow, and capabilities available in the ED at this time, and I doubt any other [facility] requiring further screening and/or treatment in the ED prior to admission.  I discussed this case with my attending physician who cosigned this note including patient's presenting symptoms, physical exam, and planned diagnostics and interventions. Attending physician stated agreement with plan or made changes to plan which were implemented.   Attending physician assessed patient at bedside.   Final Clinical Impression(s) / ED Diagnoses Final diagnoses:  None    Rx / DC Orders ED Discharge Orders    None       Alfredia Client, PA-C 01/11/20 1347    Charlesetta Shanks, MD 01/14/20 1511

## 2020-01-11 NOTE — Anesthesia Preprocedure Evaluation (Addendum)
Anesthesia Evaluation  Patient identified by MRN, date of birth, ID band Patient awake    Reviewed: Allergy & Precautions, NPO status , Patient's Chart, lab work & pertinent test results  Airway Mallampati: II  TM Distance: >3 FB Neck ROM: Full    Dental  (+) Dental Advisory Given, Caps,    Pulmonary neg pulmonary ROS,    Pulmonary exam normal breath sounds clear to auscultation       Cardiovascular negative cardio ROS   Rhythm:Regular Rate:Tachycardia     Neuro/Psych negative neurological ROS     GI/Hepatic Neg liver ROS, upper GI bleeding   Endo/Other  negative endocrine ROS  Renal/GU negative Renal ROS     Musculoskeletal negative musculoskeletal ROS (+)   Abdominal   Peds  Hematology  (+) Blood dyscrasia, anemia , REFUSES BLOOD PRODUCTS, JEHOVAH'S WITNESSrefuses blood products but is OK with albumin   Anesthesia Other Findings Day of surgery medications reviewed with the patient.  Reproductive/Obstetrics                            Anesthesia Physical Anesthesia Plan  ASA: II  Anesthesia Plan: MAC   Post-op Pain Management:    Induction: Intravenous  PONV Risk Score and Plan: 2 and Propofol infusion and Treatment may vary due to age or medical condition  Airway Management Planned: Nasal Cannula and Natural Airway  Additional Equipment:   Intra-op Plan:   Post-operative Plan:   Informed Consent: I have reviewed the patients History and Physical, chart, labs and discussed the procedure including the risks, benefits and alternatives for the proposed anesthesia with the patient or authorized representative who has indicated his/her understanding and acceptance.     Dental advisory given  Plan Discussed with: CRNA  Anesthesia Plan Comments:        Anesthesia Quick Evaluation

## 2020-01-11 NOTE — Progress Notes (Signed)
Patient arrived to room 6N3 from Endo via stretcher. Patient is alert and oriented times 4. VSS. NAD. Belongings and call bell within reach. Bed is in the lowest and locked position with bed rails up times 2. Patient oriented to unit and call bell. Patient updated as to plan of care and all questions addressed at this time.

## 2020-01-11 NOTE — Transfer of Care (Signed)
Immediate Anesthesia Transfer of Care Note  Patient: Lori Mcbride  Procedure(s) Performed: ESOPHAGOGASTRODUODENOSCOPY (EGD) (N/A ) HEMOSTASIS CLIP PLACEMENT HEMOSTASIS CONTROL  Patient Location: Endoscopy Unit  Anesthesia Type:MAC  Level of Consciousness: awake and alert   Airway & Oxygen Therapy: Patient Spontanous Breathing  Post-op Assessment: Report given to RN and Post -op Vital signs reviewed and stable  Post vital signs: Reviewed and stable  Last Vitals:  Vitals Value Taken Time  BP 135/95 01/11/20 1616  Temp 37.1 C 01/11/20 1613  Pulse 112 01/11/20 1616  Resp 21 01/11/20 1616  SpO2 96 % 01/11/20 1616  Vitals shown include unvalidated device data.  Last Pain:  Vitals:   01/11/20 1613  TempSrc: Oral  PainSc: 0-No pain         Complications: No complications documented.

## 2020-01-11 NOTE — Progress Notes (Signed)
CRITICAL VALUE ALERT  Critical Value:  hgb 5.6  Date & Time Notied: 6/23 7:30pm  Provider Notified: yes  Orders Received/Actions taken:

## 2020-01-11 NOTE — H&P (Signed)
The patient is a 54 year old female who presented to the emergency room with complaints of hematemesis and melena.  She is a Scientist, product/process development.  She is anemic.  She is in the endoscopy unit now to have EGD for evaluation.  Physical  No distress  Heart regular rhythm  Lungs clear  Abdomen soft and nontender  Impression upper GI bleed  Plan EGD

## 2020-01-12 ENCOUNTER — Encounter (HOSPITAL_COMMUNITY): Payer: Self-pay | Admitting: Internal Medicine

## 2020-01-12 DIAGNOSIS — K3182 Dieulafoy lesion (hemorrhagic) of stomach and duodenum: Secondary | ICD-10-CM | POA: Diagnosis present

## 2020-01-12 DIAGNOSIS — D62 Acute posthemorrhagic anemia: Secondary | ICD-10-CM | POA: Diagnosis present

## 2020-01-12 DIAGNOSIS — Z20822 Contact with and (suspected) exposure to covid-19: Secondary | ICD-10-CM | POA: Diagnosis present

## 2020-01-12 DIAGNOSIS — K31811 Angiodysplasia of stomach and duodenum with bleeding: Secondary | ICD-10-CM | POA: Diagnosis not present

## 2020-01-12 DIAGNOSIS — R739 Hyperglycemia, unspecified: Secondary | ICD-10-CM | POA: Diagnosis present

## 2020-01-12 DIAGNOSIS — K922 Gastrointestinal hemorrhage, unspecified: Secondary | ICD-10-CM | POA: Diagnosis present

## 2020-01-12 DIAGNOSIS — Z531 Procedure and treatment not carried out because of patient's decision for reasons of belief and group pressure: Secondary | ICD-10-CM | POA: Diagnosis present

## 2020-01-12 DIAGNOSIS — K254 Chronic or unspecified gastric ulcer with hemorrhage: Secondary | ICD-10-CM | POA: Diagnosis present

## 2020-01-12 LAB — BASIC METABOLIC PANEL
Anion gap: 8 (ref 5–15)
BUN: 5 mg/dL — ABNORMAL LOW (ref 6–20)
CO2: 25 mmol/L (ref 22–32)
Calcium: 8.3 mg/dL — ABNORMAL LOW (ref 8.9–10.3)
Chloride: 106 mmol/L (ref 98–111)
Creatinine, Ser: 0.66 mg/dL (ref 0.44–1.00)
GFR calc Af Amer: >60 mL/min (ref >60)
GFR calc non Af Amer: >60 mL/min (ref >60)
Glucose, Bld: 114 mg/dL — ABNORMAL HIGH (ref 70–99)
Potassium: 3.7 mmol/L (ref 3.5–5.1)
Sodium: 139 mmol/L (ref 135–145)

## 2020-01-12 LAB — CBC
HCT: 16.8 % — ABNORMAL LOW (ref 36.0–46.0)
Hemoglobin: 5.3 g/dL — CL (ref 12.0–15.0)
MCH: 27.9 pg (ref 26.0–34.0)
MCHC: 31.5 g/dL (ref 30.0–36.0)
MCV: 88.4 fL (ref 80.0–100.0)
Platelets: 210 10*3/uL (ref 150–400)
RBC: 1.9 MIL/uL — ABNORMAL LOW (ref 3.87–5.11)
RDW: 15.9 % — ABNORMAL HIGH (ref 11.5–15.5)
WBC: 6.2 10*3/uL (ref 4.0–10.5)
nRBC: 1 % — ABNORMAL HIGH (ref 0.0–0.2)

## 2020-01-12 NOTE — Progress Notes (Signed)
The patient was doing well today.  No complaints.  Tolerating clears.  No abdominal pain and no signs of bleeding.  Hemoglobin noted.  We will continue to follow her clinically.  Recheck hemoglobin and hematocrit and watch for signs of bleeding.

## 2020-01-12 NOTE — Progress Notes (Signed)
Pt's hgb this am is 5.3, M. Katherina Right, NP is aware , pt is still refusing blood transfusion due to pt's religion. No further order made.

## 2020-01-12 NOTE — Progress Notes (Signed)
PROGRESS NOTE  Lori Mcbride VHQ:[id number] DOB: Nov 16, 1965 DOA: 01/11/2020 PCP: Zachery Dakins, MD   LOS: 0 days   Brief Narrative / Interim history: 54 year old female, Jehovah's Witness without significant medical history came into the hospital with presyncopal episode, black stools for couple of days as well as abdominal pain and vomiting with coffee-ground material.  She initially went to [facility] however waited too much for transfer to an outside hospital and left AMA and drove herself to [facility].  GI was consulted and she underwent an EGD which showed an oozing gastric ulcer with a visible vessel status post injection and clips  Subjective / 24h Interval events: She tells me she is feeling much better this morning.  Denies any abdominal pain, no nausea or vomiting.  Has not tried to eat yet  Assessment & Plan: Principal Problem Acute blood loss anemia due to acute upper GI bleed due to gastric ulcer - gastroenterology consulted, she underwent EGD on admission on 6/23 which showed it was a gastric ulcer with a visible vessel status post epi injection as well as clip placement.  Have discussed her case with Dr. Evette Cristal this morning.  Clinically it appears that her bleeding has stopped however her hemoglobin is continuing to trend down.  It is 5.3 this morning, she is Jehovah's Witness and will not accept blood products..  Continue to closely monitor, still requiring inpatient needs, discussed with GI,, slowly advance diet and allow clear liquids today.  Continue PPI.  Minimize blood draws, repeat CBC tomorrow morning.  Also obtain anemia panel   Scheduled Meds: . sodium chloride flush  3 mL Intravenous Q12H   Continuous Infusions: . lactated ringers 50 mL/hr at 01/11/20 1222  . pantoprozole (PROTONIX) infusion 8 mg/hr (01/12/20 0548)   PRN Meds:.acetaminophen **OR** acetaminophen, ondansetron **OR** ondansetron (ZOFRAN) IV  DVT prophylaxis: SCDs Code Status: Full  code Family Communication: No family at bedside  Status is: Observation  The patient will require care spanning > 2 midnights and should be moved to inpatient because: Inpatient level of care appropriate due to severity of illness  Dispo: The patient is from: Home              Anticipated d/c is to: Home              Anticipated d/c date is: 1 day              Patient currently is not medically stable to d/c.  Consultants:  GI  Procedures:  EGD 6/23  Impression:               - Normal esophagus.                           - Oozing gastric ulcer with a visible vessel.                            Injected. Clips were placed.                           - Dieulafoy lesion of stomach.                           - Normal examined duodenum.                           -  No specimens collected. Recommendation:           - Clear liquid diet.                           - Continue present medications.                           - Observe for further bleeding.  Microbiology  None   Antimicrobials: None     Objective: Vitals:   01/11/20 1659 01/11/20 2205 01/12/20 0106 01/12/20 0523  BP: (!) 116/56 119/64 105/64 110/69  Pulse: (!) 105 99 97 86  Resp: 18 18 17 18   Temp: 98.8 F (37.1 C) 98.7 F (37.1 C) 98.7 F (37.1 C) 98.4 F (36.9 C)  TempSrc: Oral Oral Oral Oral  SpO2: 99% 99% 98% 100%  Weight:      Height:        Intake/Output Summary (Last 24 hours) at 01/12/2020 1056 Last data filed at 01/11/2020 1706 Gross per 24 hour  Intake 364.65 ml  Output --  Net 364.65 ml   Filed Weights   01/11/20 0329  Weight: 77.1 kg    Examination:  Constitutional: NAD Eyes: no scleral icterus ENMT: Mucous membranes are moist.  Neck: normal, supple Respiratory: clear to auscultation bilaterally, no wheezing, no crackles. Cardiovascular: Regular rate and rhythm, no murmurs / rubs / gallops. No LE edema. Abdomen: non distended, no tenderness. Bowel sounds positive.  Musculoskeletal: no  clubbing / cyanosis.  Skin: no rashes Neurologic: Nonfocal    Data Reviewed: I have independently reviewed following labs and imaging studies   CBC: Recent Labs  Lab 01/11/20 0354 01/11/20 1846 01/12/20 0154  WBC 8.9 7.5 6.2  HGB 6.4* 5.6* 5.3*  HCT 19.8* 17.6* 16.8*  MCV 88.0 86.7 88.4  PLT 229 211 323   Basic Metabolic Panel: Recent Labs  Lab 01/11/20 0354 01/12/20 0154  NA 139 139  K 3.4* 3.7  CL 109 106  CO2 21* 25  GLUCOSE 123* 114*  BUN 7 5*  CREATININE 0.64 0.66  CALCIUM 8.4* 8.3*   Liver Function Tests: Recent Labs  Lab 01/11/20 0354  AST 20  ALT 16  ALKPHOS 53  BILITOT 0.8  PROT 5.4*  ALBUMIN 3.0*   Coagulation Profile: Recent Labs  Lab 01/11/20 1020  INR 1.1   HbA1C: No results for input(s): HGBA1C in the last 72 hours. CBG: No results for input(s): GLUCAP in the last 168 hours.  Recent Results (from the past 240 hour(s))  SARS Coronavirus 2 by RT PCR (hospital order, performed in [facility] hospital lab) Nasopharyngeal Nasopharyngeal Swab     Status: None   Collection Time: 01/11/20 11:16 AM   Specimen: Nasopharyngeal Swab  Result Value Ref Range Status   SARS Coronavirus 2 NEGATIVE NEGATIVE Final    Comment: (NOTE) SARS-CoV-2 target nucleic acids are NOT DETECTED.  The SARS-CoV-2 RNA is generally detectable in upper and lower respiratory specimens during the acute phase of infection. The lowest concentration of SARS-CoV-2 viral copies this assay can detect is 250 copies / mL. A negative result does not preclude SARS-CoV-2 infection and should not be used as the sole basis for treatment or other patient management decisions.  A negative result may occur with improper specimen collection / handling, submission of specimen other than nasopharyngeal swab, presence of viral mutation(s) within the areas targeted by this assay, and inadequate number of viral  copies (<250 copies / mL). A negative result must be combined with  clinical observations, patient history, and epidemiological information.  Fact Sheet for Patients:   BoilerBrush.com.cy  Fact Sheet for Healthcare Providers: https://pope.com/  This test is not yet approved or  cleared by the [place] FDA and has been authorized for detection and/or diagnosis of SARS-CoV-2 by FDA under an Emergency Use Authorization (EUA).  This EUA will remain in effect (meaning this test can be used) for the duration of the COVID-19 declaration under Section 564(b)(1) of the Act, 21 U.S.C. section 360bbb-3(b)(1), unless the authorization is terminated or revoked sooner.  Performed at [facility], [address], [place], [place] [address]      Radiology Studies: No results found.  Pamella Pert, MD, PhD [facility]  Between 7 am - 7 pm I am available, please contact me via Amion or Securechat  Between 7 pm - 7 am I am not available, please contact night coverage MD/APP via Amion

## 2020-01-13 DIAGNOSIS — K31811 Angiodysplasia of stomach and duodenum with bleeding: Secondary | ICD-10-CM

## 2020-01-13 LAB — RETICULOCYTES
Immature Retic Fract: 39.4 % — ABNORMAL HIGH (ref 2.3–15.9)
RBC.: 1.91 MIL/uL — ABNORMAL LOW (ref 3.87–5.11)
Retic Count, Absolute: 135 10*3/uL (ref 19.0–186.0)
Retic Ct Pct: 7.1 % — ABNORMAL HIGH (ref 0.4–3.1)

## 2020-01-13 LAB — CBC
HCT: 18.4 % — ABNORMAL LOW (ref 36.0–46.0)
Hemoglobin: 5.7 g/dL — CL (ref 12.0–15.0)
MCH: 28.2 pg (ref 26.0–34.0)
MCHC: 31 g/dL (ref 30.0–36.0)
MCV: 91.1 fL (ref 80.0–100.0)
Platelets: 273 10*3/uL (ref 150–400)
RBC: 2.02 MIL/uL — ABNORMAL LOW (ref 3.87–5.11)
RDW: 16.6 % — ABNORMAL HIGH (ref 11.5–15.5)
WBC: 6.5 10*3/uL (ref 4.0–10.5)
nRBC: 1.5 % — ABNORMAL HIGH (ref 0.0–0.2)

## 2020-01-13 LAB — VITAMIN B12: Vitamin B-12: 589 pg/mL (ref 180–914)

## 2020-01-13 LAB — IRON AND TIBC
Iron: 121 ug/dL (ref 28–170)
Saturation Ratios: 35 % — ABNORMAL HIGH (ref 10.4–31.8)
TIBC: 344 ug/dL (ref 250–450)
UIBC: 223 ug/dL

## 2020-01-13 LAB — FOLATE: Folate: 21.7 ng/mL (ref >5.9)

## 2020-01-13 LAB — FERRITIN: Ferritin: 100 ng/mL (ref 11–307)

## 2020-01-13 MED ORDER — PANTOPRAZOLE SODIUM 40 MG PO TBEC
40.0000 mg | DELAYED_RELEASE_TABLET | Freq: Two times a day (BID) | ORAL | 2 refills | Status: DC
Start: 2020-01-13 — End: 2021-01-29

## 2020-01-13 MED ORDER — FERROUS GLUCONATE 324 (38 FE) MG PO TABS
324.0000 mg | ORAL_TABLET | Freq: Every day | ORAL | 2 refills | Status: DC
Start: 2020-01-13 — End: 2021-01-29

## 2020-01-13 NOTE — Discharge Instructions (Signed)
Follow with Dr Penelope Coop in 3 weeks  Please take Protonix twice daily for a month then once daily Please take iron supplements for a month Please have your blood work checked by your primary MD in 1 week Please avoid NSAIDs (antiinflammatory medications). If you are not sure if a particular medication is an NSAID ask your doctor or pharmacist  Please get a complete blood count and chemistry panel checked by your Primary MD at your next visit, and again as instructed by your Primary MD. Please get your medications reviewed and adjusted by your Primary MD.  Please request your Primary MD to go over all Hospital Tests and Procedure/Radiological results at the follow up, please get all Hospital records sent to your Prim MD by signing hospital release before you go home.  In some cases, there will be blood work, cultures and biopsy results pending at the time of your discharge. Please request that your primary care M.D. goes through all the records of your hospital data and follows up on these results.  If you had Pneumonia of Lung problems at the Hospital: Please get a 2 view Chest X ray done in 6-8 weeks after hospital discharge or sooner if instructed by your Primary MD.  If you have Congestive Heart Failure: Please call your Cardiologist or Primary MD anytime you have any of the following symptoms:  1) 3 pound weight gain in 24 hours or 5 pounds in 1 week  2) shortness of breath, with or without a dry hacking cough  3) swelling in the hands, feet or stomach  4) if you have to sleep on extra pillows at night in order to breathe  Follow cardiac low salt diet and 1.5 lit/day fluid restriction.  If you have diabetes Accuchecks 4 times/day, Once in AM empty stomach and then before each meal. Log in all results and show them to your primary doctor at your next visit. If any glucose reading is under 80 or above 300 call your primary MD immediately.  If you have Seizure/Convulsions/Epilepsy: Please  do not drive, operate heavy machinery, participate in activities at heights or participate in high speed sports until you have seen by Primary MD or a Neurologist and advised to do so again. Per [facility] statutes, patients with seizures are not allowed to drive until they have been seizure-free for six months.  Use caution when using heavy equipment or power tools. Avoid working on ladders or at heights. Take showers instead of baths. Ensure the water temperature is not too high on the home water heater. Do not go swimming alone. Do not lock yourself in a room alone (i.e. bathroom). When caring for infants or small children, sit down when holding, feeding, or changing them to minimize risk of injury to the child in the event you have a seizure. Maintain good sleep hygiene. Avoid alcohol.   If you had Gastrointestinal Bleeding: Please ask your Primary MD to check a complete blood count within one week of discharge or at your next visit. Your endoscopic/colonoscopic biopsies that are pending at the time of discharge, will also need to followed by your Primary MD.  Get Medicines reviewed and adjusted. Please take all your medications with you for your next visit with your Primary MD  Please request your Primary MD to go over all hospital tests and procedure/radiological results at the follow up, please ask your Primary MD to get all Hospital records sent to his/her office.  If you experience worsening of  your admission symptoms, develop shortness of breath, life threatening emergency, suicidal or homicidal thoughts you must seek medical attention immediately by calling 911 or calling your MD immediately  if symptoms less severe.  You must read complete instructions/literature along with all the possible adverse reactions/side effects for all the Medicines you take and that have been prescribed to you. Take any new Medicines after you have completely understood and accpet all the possible adverse  reactions/side effects.   Do not drive or operate heavy machinery when taking Pain medications.   Do not take more than prescribed Pain, Sleep and Anxiety Medications  Special Instructions: If you have smoked or chewed Tobacco  in the last 2 yrs please stop smoking, stop any regular Alcohol  and or any Recreational drug use.  Wear Seat belts while driving.  Please note You were cared for by a hospitalist during your hospital stay. If you have any questions about your discharge medications or the care you received while you were in the hospital after you are discharged, you can call the unit and asked to speak with the hospitalist on call if the hospitalist that took care of you is not available. Once you are discharged, your primary care physician will handle any further medical issues. Please note that NO REFILLS for any discharge medications will be authorized once you are discharged, as it is imperative that you return to your primary care physician (or establish a relationship with a primary care physician if you do not have one) for your aftercare needs so that they can reassess your need for medications and monitor your lab values.  You can reach the hospitalist office at phone [phone] or fax [phone]   If you do not have a primary care physician, you can call [phone] for a physician referral.  Activity: As tolerated with Full fall precautions use walker/cane & assistance as needed    Diet: regular  Disposition Home

## 2020-01-13 NOTE — Progress Notes (Signed)
Patient discharged to home with instructions and prescriptions. 

## 2020-01-13 NOTE — Discharge Summary (Addendum)
Physician Discharge Summary  Lori Mcbride JQB:[id number] DOB: 1966/06/19 DOA: 01/11/2020  PCP: Janine Limbo, MD  Admit date: 01/11/2020 Discharge date: 01/13/2020  Admitted From: home Disposition:  home  Recommendations for Outpatient Follow-up:  1. Follow up with PCP in 1-2 weeks 2. Please obtain BMP/CBC in one week 3. Follow-up with Dr. Penelope Coop with gastroenterology in 3 weeks 4. H. pylori IgM, IgG pending at the time of discharge  Home Health: None Equipment/Devices: None  Discharge Condition: Stable CODE STATUS: Full code Diet recommendation: Regular diet  HPI: Per admitting MD, Lori Mcbride is a 54 y.o. 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  Sunday and Monday she had very dark stools (solid).  Tuesday about 0400, she passed out while going to the bathroom.  She was in the floor for 10-15 minutes and then she had abdominal pain and vomited significant blood and dark colored substance.  An hour later, she passed out again.  She called 911 and was taken to [facility].  She had was sounds like a CT, "they figure out that it's a stomach problem."  They did not have GI there and so she signed out AMA and drove herself here.  Midepigastric pain.  No medications but [place] something for inflammation [place]    Hospital Course / Discharge diagnoses: Principal Problem Acute blood loss anemia due to acute upper GI bleed due to gastric ulcer - gastroenterology consulted, she underwent EGD on admission on 6/23 which showed it was a gastric ulcer with a visible vessel status post epi injection as well as clip placement.  Following EGD she recovered well, her diet was slowly advanced and she is tolerating a regular diet.  Her hemoglobin dipped to 5.3 and gradually improving on its own and is 5.7 at the time of discharge.  Patient is a Sales promotion account executive Witness and does not accept blood transfusions.  She is feeling back to baseline,  tolerating diet, no abdominal pain, no nausea or vomiting, and will be discharged home in stable condition.  She will be placed on a PPI twice daily for the next month and then once daily.  She will also be placed on iron supplementation given recent bleed.  She was advised to avoid all NSAIDs.  Her melena has resolved as well on the day of discharge.   Discharge Instructions   Allergies as of 01/13/2020   No Known Allergies     Medication List    TAKE these medications   ferrous gluconate 324 MG tablet Commonly known as: FERGON Take 1 tablet (324 mg total) by mouth daily with breakfast.   pantoprazole 40 MG tablet Commonly known as: Protonix Take 1 tablet (40 mg total) by mouth 2 (two) times daily. For a month, then take once daily 30 minutes before eating       Follow-up Information    Wonda Horner, MD. Schedule an appointment as soon as possible for a visit in 3 week(s).   Specialty: Gastroenterology Contact information: [address] [place] [address] [phone]               Consultations:  GI  Procedures/Studies: EGD 6/23  Impression: - Normal esophagus. - Oozing gastric ulcer with a visible vessel.  Injected. Clips were placed. - Dieulafoy lesion of stomach. - Normal examined duodenum. - No specimens collected. Recommendation: - Clear liquid diet. - Continue present medications. - Observe for further bleeding.  No results found.   Subjective: - no chest pain, shortness of breath, no abdominal pain, nausea or vomiting.   Discharge Exam: BP 117/62 (BP Location: Left Arm)   Pulse 89   Temp 98.4 F (36.9 C) (Oral)   Resp 16   Ht 5\' 4"  (1.626 m)   Wt 77.1 kg   SpO2 100%   BMI 29.18 kg/m   General: Pt is alert, awake,  not in acute distress Cardiovascular: RRR, S1/S2 +, no rubs, no gallops Respiratory: CTA bilaterally, no wheezing, no rhonchi Abdominal: Soft, NT, ND, bowel sounds + Extremities: no edema, no cyanosis   The results of significant diagnostics from this hospitalization (including imaging, microbiology, ancillary and laboratory) are listed below for reference.     Microbiology: Recent Results (from the past 240 hour(s))  SARS Coronavirus 2 by RT PCR (hospital order, performed in [facility] hospital lab) Nasopharyngeal Nasopharyngeal Swab     Status: None   Collection Time: 01/11/20 11:16 AM   Specimen: Nasopharyngeal Swab  Result Value Ref Range Status   SARS Coronavirus 2 NEGATIVE NEGATIVE Final    Comment: (NOTE) SARS-CoV-2 target nucleic acids are NOT DETECTED.  The SARS-CoV-2 RNA is generally detectable in upper and lower respiratory specimens during the acute phase of infection. The lowest concentration of SARS-CoV-2 viral copies this assay can detect is 250 copies / mL. A negative result does not preclude SARS-CoV-2 infection and should not be used as the sole basis for treatment or other patient management decisions.  A negative result may occur with improper specimen collection / handling, submission of specimen other than nasopharyngeal swab, presence of viral mutation(s) within the areas targeted by this assay, and inadequate number of viral copies (<250 copies / mL). A negative result must be combined with clinical observations, patient history, and epidemiological information.  Fact Sheet for Patients:   01/13/20  Fact Sheet for Healthcare Providers: BoilerBrush.com.cy  This test is not yet approved or  cleared by the https://pope.com/ FDA and has been authorized for detection and/or diagnosis of SARS-CoV-2 by FDA under an Emergency Use Authorization (EUA).  This EUA will remain in effect (meaning this test can be  used) for the duration of the COVID-19 declaration under Section 564(b)(1) of the Act, 21 U.S.C. section 360bbb-3(b)(1), unless the authorization is terminated or revoked sooner.  Performed at [facility], [address] [place], [place]      Labs: Basic Metabolic Panel: Recent Labs  Lab 01/11/20 0354 01/12/20 0154  NA 139 139  K 3.4* 3.7  CL 109 106  CO2 21* 25  GLUCOSE 123* 114*  BUN 7 5*  CREATININE 0.64 0.66  CALCIUM 8.4* 8.3*   Liver Function Tests: Recent Labs  Lab 01/11/20 0354  AST 20  ALT 16  ALKPHOS 53  BILITOT 0.8  PROT 5.4*  ALBUMIN 3.0*   CBC: Recent Labs  Lab 01/11/20 0354 01/11/20 1846 01/12/20 0154 01/13/20 0254  WBC 8.9 7.5 6.2 6.5  HGB 6.4* 5.6* 5.3* 5.7*  HCT 19.8* 17.6* 16.8* 18.4*  MCV 88.0 86.7 88.4 91.1  PLT 229 211 210 273   CBG: No results for input(s): GLUCAP in the last 168 hours. Hgb A1c No results for input(s): HGBA1C in the last 72 hours. Lipid Profile No results for input(s): CHOL, HDL, LDLCALC, TRIG, CHOLHDL, LDLDIRECT in the last 72 hours. Thyroid function studies No results for input(s): TSH, T4TOTAL, T3FREE, THYROIDAB in the last 72 hours.  Invalid input(s): FREET3 Urinalysis  Component Value Date/Time   COLORURINE STRAW (A) 01/11/2020 0351   APPEARANCEUR CLEAR 01/11/2020 0351   LABSPEC 1.010 01/11/2020 0351   PHURINE 6.0 01/11/2020 0351   GLUCOSEU NEGATIVE 01/11/2020 0351   HGBUR NEGATIVE 01/11/2020 0351   BILIRUBINUR NEGATIVE 01/11/2020 0351   KETONESUR NEGATIVE 01/11/2020 0351   PROTEINUR NEGATIVE 01/11/2020 0351   NITRITE NEGATIVE 01/11/2020 0351   LEUKOCYTESUR NEGATIVE 01/11/2020 0351    FURTHER DISCHARGE INSTRUCTIONS:   Get Medicines reviewed and adjusted: Please take all your medications with you for your next visit with your Primary MD   Laboratory/radiological data: Please request your Primary MD to go over all hospital tests and procedure/radiological results at the follow  up, please ask your Primary MD to get all Hospital records sent to his/her office.   In some cases, they will be blood work, cultures and biopsy results pending at the time of your discharge. Please request that your primary care M.D. goes through all the records of your hospital data and follows up on these results.   Also Note the following: If you experience worsening of your admission symptoms, develop shortness of breath, life threatening emergency, suicidal or homicidal thoughts you must seek medical attention immediately by calling 911 or calling your MD immediately  if symptoms less severe.   You must read complete instructions/literature along with all the possible adverse reactions/side effects for all the Medicines you take and that have been prescribed to you. Take any new Medicines after you have completely understood and accpet all the possible adverse reactions/side effects.    Do not drive when taking Pain medications or sleeping medications (Benzodaizepines)   Do not take more than prescribed Pain, Sleep and Anxiety Medications. It is not advisable to combine anxiety,sleep and pain medications without talking with your primary care practitioner   Special Instructions: If you have smoked or chewed Tobacco  in the last 2 yrs please stop smoking, stop any regular Alcohol  and or any Recreational drug use.   Wear Seat belts while driving.   Please note: You were cared for by a hospitalist during your hospital stay. Once you are discharged, your primary care physician will handle any further medical issues. Please note that NO REFILLS for any discharge medications will be authorized once you are discharged, as it is imperative that you return to your primary care physician (or establish a relationship with a primary care physician if you do not have one) for your post hospital discharge needs so that they can reassess your need for medications and monitor your lab values.  Time  coordinating discharge: 45 minutes  SIGNED:  Pamella Pert, MD, PhD 01/13/2020, 1:26 PM

## 2020-01-16 LAB — H PYLORI, IGM, IGG, IGA AB
H Pylori IgG: 4.96 Index Value — ABNORMAL HIGH (ref 0.00–0.79)
H. Pylogi, Iga Abs: 31.4 units — ABNORMAL HIGH (ref 0.0–8.9)
H. Pylogi, Igm Abs: <9 units (ref 0.0–8.9)

## 2021-01-29 ENCOUNTER — Emergency Department (HOSPITAL_COMMUNITY)
Admission: EM | Admit: 2021-01-29 | Discharge: 2021-01-29 | Disposition: A | Discharge status: Home / Self Care | Payer: 59 | Attending: Emergency Medicine | Admitting: Emergency Medicine

## 2021-01-29 ENCOUNTER — Encounter (HOSPITAL_COMMUNITY): Payer: Self-pay | Admitting: Emergency Medicine

## 2021-01-29 DIAGNOSIS — K921 Melena: Secondary | ICD-10-CM | POA: Insufficient documentation

## 2021-01-29 DIAGNOSIS — I493 Ventricular premature depolarization: Secondary | ICD-10-CM | POA: Insufficient documentation

## 2021-01-29 DIAGNOSIS — R000 Tachycardia, unspecified: Secondary | ICD-10-CM | POA: Diagnosis not present

## 2021-01-29 DIAGNOSIS — R195 Other fecal abnormalities: Secondary | ICD-10-CM

## 2021-01-29 LAB — URINALYSIS, ROUTINE W REFLEX MICROSCOPIC
Bilirubin Urine: NEGATIVE
Glucose, UA: 150 mg/dL — AB
Hgb urine dipstick: NEGATIVE
Ketones, ur: NEGATIVE mg/dL
Nitrite: NEGATIVE
Protein, ur: NEGATIVE mg/dL
Specific Gravity, Urine: 1.01 (ref 1.005–1.030)
pH: 5 (ref 5.0–8.0)

## 2021-01-29 LAB — CBC WITH DIFFERENTIAL/PLATELET
Abs Immature Granulocytes: 0.03 10*3/uL (ref 0.00–0.07)
Basophils Absolute: 0 10*3/uL (ref 0.0–0.1)
Basophils Relative: 1 %
Eosinophils Absolute: 0 10*3/uL (ref 0.0–0.5)
Eosinophils Relative: 0 %
HCT: 39.9 % (ref 36.0–46.0)
Hemoglobin: 13 g/dL (ref 12.0–15.0)
Immature Granulocytes: 1 %
Lymphocytes Relative: 16 %
Lymphs Abs: 1.1 10*3/uL (ref 0.7–4.0)
MCH: 28.4 pg (ref 26.0–34.0)
MCHC: 32.6 g/dL (ref 30.0–36.0)
MCV: 87.3 fL (ref 80.0–100.0)
Monocytes Absolute: 0.2 10*3/uL (ref 0.1–1.0)
Monocytes Relative: 4 %
Neutro Abs: 5.2 10*3/uL (ref 1.7–7.7)
Neutrophils Relative %: 78 %
Platelets: 259 10*3/uL (ref 150–400)
RBC: 4.57 MIL/uL (ref 3.87–5.11)
RDW: 13.1 % (ref 11.5–15.5)
WBC: 6.6 10*3/uL (ref 4.0–10.5)
nRBC: 0 % (ref 0.0–0.2)

## 2021-01-29 LAB — COMPREHENSIVE METABOLIC PANEL
ALT: 26 U/L (ref 0–44)
AST: 24 U/L (ref 15–41)
Albumin: 4 g/dL (ref 3.5–5.0)
Alkaline Phosphatase: 83 U/L (ref 38–126)
Anion gap: 7 (ref 5–15)
BUN: 11 mg/dL (ref 6–20)
CO2: 24 mmol/L (ref 22–32)
Calcium: 9.4 mg/dL (ref 8.9–10.3)
Chloride: 108 mmol/L (ref 98–111)
Creatinine, Ser: 0.67 mg/dL (ref 0.44–1.00)
GFR, Estimated: >60 mL/min (ref >60)
Glucose, Bld: 120 mg/dL — ABNORMAL HIGH (ref 70–99)
Potassium: 4 mmol/L (ref 3.5–5.1)
Sodium: 139 mmol/L (ref 135–145)
Total Bilirubin: 0.4 mg/dL (ref 0.3–1.2)
Total Protein: 7.4 g/dL (ref 6.5–8.1)

## 2021-01-29 LAB — CBC
HCT: 38.6 % (ref 36.0–46.0)
Hemoglobin: 12.8 g/dL (ref 12.0–15.0)
MCH: 28.6 pg (ref 26.0–34.0)
MCHC: 33.2 g/dL (ref 30.0–36.0)
MCV: 86.4 fL (ref 80.0–100.0)
Platelets: 227 10*3/uL (ref 150–400)
RBC: 4.47 MIL/uL (ref 3.87–5.11)
RDW: 13.1 % (ref 11.5–15.5)
WBC: 6.9 10*3/uL (ref 4.0–10.5)
nRBC: 0 % (ref 0.0–0.2)

## 2021-01-29 LAB — PROTIME-INR
INR: 0.9 (ref 0.8–1.2)
Prothrombin Time: 12.4 seconds (ref 11.4–15.2)

## 2021-01-29 MED ORDER — PANTOPRAZOLE SODIUM 20 MG PO TBEC
20.0000 mg | DELAYED_RELEASE_TABLET | Freq: Every day | ORAL | 0 refills | Status: DC
Start: 2021-01-29 — End: 2022-03-31

## 2021-01-29 MED ORDER — FERROUS SULFATE 325 (65 FE) MG PO TABS
325.0000 mg | ORAL_TABLET | Freq: Every day | ORAL | 0 refills | Status: DC
Start: 2021-01-29 — End: 2021-10-14

## 2021-01-29 NOTE — ED Provider Notes (Signed)
Emergency Medicine Provider Triage Evaluation Note  Paradise Vensel , a 55 y.o. female  was evaluated in triage.  Pt complains of dark tarry stools, going on for last couple days, states that she has had GI bleed in the past where she had a oozing ulcer.  She is on iron at this time, is not taking Pepto-Bismol, has no complaints, she denies chest pain, shortness of breath, denies hematuria.  Review of Systems  Positive: Dark tarry stools Negative: Chest pain, shortness of breath  Physical Exam  BP 121/73 (BP Location: Left Arm)   Pulse (!) 117   Temp 98.6 F (37 C) (Oral)   Resp 16   SpO2 98%  Gen:   Awake, no distress   Resp:  Normal effort  MSK:   Moves extremities without difficulty  Other:    Medical Decision Making  Medically screening exam initiated at 3:45 PM.  Appropriate orders placed.  Davena Julian was informed that the remainder of the evaluation will be completed by another provider, this initial triage assessment does not replace that evaluation, and the importance of remaining in the ED until their evaluation is complete.  Patient presents with concern of dark tarry stools, will obtain lab work patient will need further work-up here in the emergency department.   Carroll Sage, PA-C 01/29/21 1546    Gerhard Munch, MD 01/29/21 [phone]

## 2021-01-29 NOTE — ED Provider Notes (Signed)
[facility] EMERGENCY DEPARTMENT Provider Note   CSN: [id number] Arrival date & time: 01/29/21  1348     History Chief Complaint  Patient presents with   Blood In Stools    Lori Mcbride is a 55 y.o. female.  55 yo F with a chief complaints of dark tarry stools.  Going on for about 4 days now.  Patient went to urgent care and was told that it was positive for blood and was sent here for evaluation.  She had a prior GI bleed about a year ago that required hospitalization and urgent endoscopy.  At that time she was encouraged if this was to ever happen again that she should come sooner rather than later.  She denies any abdominal pain denies any nausea or vomiting.  Has only been having about 1 bowel movement today.  Denies diarrhea.  The history is provided by the patient and the spouse.  Rectal Bleeding Quality:  Black and tarry Amount:  Scant Duration:  4 days Timing:  Constant Chronicity:  Recurrent Similar prior episodes: yes   Relieved by:  Nothing Worsened by:  Nothing Ineffective treatments:  None tried Associated symptoms: no dizziness, no fever, no light-headedness and no vomiting       Past Medical History:  Diagnosis Date   GI bleeding 12/2019   Knee pain, right    Refusal of blood product    Refusal of blood transfusions as patient is Jehovah's Witness     Patient Active Problem List   Diagnosis Date Noted   GI bleed 01/12/2020   Acute upper GI bleed 01/11/2020   Hyperglycemia 01/11/2020    Past Surgical History:  Procedure Laterality Date   APPENDECTOMY     ESOPHAGOGASTRODUODENOSCOPY N/A 01/11/2020   Procedure: ESOPHAGOGASTRODUODENOSCOPY (EGD);  Surgeon: Graylin Shiver, MD;  Location: [facility] ENDOSCOPY;  Service: Endoscopy;  Laterality: N/A;   HEMOSTASIS CLIP PLACEMENT  01/11/2020   Procedure: HEMOSTASIS CLIP PLACEMENT;  Surgeon: Graylin Shiver, MD;  Location: [facility] ENDOSCOPY;  Service: Endoscopy;;   HEMOSTASIS CONTROL  01/11/2020   Procedure:  HEMOSTASIS CONTROL;  Surgeon: Graylin Shiver, MD;  Location: [facility] ENDOSCOPY;  Service: Endoscopy;;  epinephrine injection     OB History   No obstetric history on file.     No family history on file.  Social History   Tobacco Use   Smoking status: Never   Smokeless tobacco: Never  Vaping Use   Vaping Use: Never used  Substance Use Topics   Alcohol use: Never   Drug use: Never    Home Medications Prior to Admission medications   Medication Sig Start Date End Date Taking? Authorizing Provider  Cholecalciferol (VITAMIN D3 PO) Take 1 tablet by mouth daily.   Yes [provider]  ferrous sulfate 325 (65 FE) MG tablet Take 1 tablet (325 mg total) by mouth daily. 01/29/21  Yes Melene Plan, DO  pantoprazole (PROTONIX) 20 MG tablet Take 1 tablet (20 mg total) by mouth daily. 01/29/21 02/28/21 Yes Melene Plan, DO    Allergies    Patient has no known allergies.  Review of Systems   Review of Systems  Constitutional:  Negative for chills and fever.  HENT:  Negative for congestion and rhinorrhea.   Eyes:  Negative for redness and visual disturbance.  Respiratory:  Negative for shortness of breath and wheezing.   Cardiovascular:  Negative for chest pain and palpitations.  Gastrointestinal:  Positive for anal bleeding, blood in stool and hematochezia. Negative for  nausea and vomiting.  Genitourinary:  Negative for dysuria and urgency.  Musculoskeletal:  Negative for arthralgias and myalgias.  Skin:  Negative for pallor and wound.  Neurological:  Negative for dizziness, light-headedness and headaches.   Physical Exam Updated Vital Signs BP (!) 115/104   Pulse (!) 106   Temp 99 F (37.2 C)   Resp 15   SpO2 97%   Physical Exam Vitals and nursing note reviewed.  Constitutional:      General: She is not in acute distress.    Appearance: She is well-developed. She is not diaphoretic.  HENT:     Head: Normocephalic and atraumatic.  Eyes:     Pupils: Pupils are equal, round,  and reactive to light.  Cardiovascular:     Rate and Rhythm: Regular rhythm. Tachycardia present.     Heart sounds: No murmur heard.   No friction rub. No gallop.  Pulmonary:     Effort: Pulmonary effort is normal.     Breath sounds: No wheezing or rales.  Abdominal:     General: There is no distension.     Palpations: Abdomen is soft.     Tenderness: There is no abdominal tenderness.  Musculoskeletal:        General: No tenderness.     Cervical back: Normal range of motion and neck supple.  Skin:    General: Skin is warm and dry.  Neurological:     Mental Status: She is alert and oriented to person, place, and time.  Psychiatric:        Behavior: Behavior normal.    ED Results / Procedures / Treatments   Labs (all labs ordered are listed, but only abnormal results are displayed) Labs Reviewed  COMPREHENSIVE METABOLIC PANEL - Abnormal; Notable for the following components:      Result Value   Glucose, Bld 120 (*)    All other components within normal limits  URINALYSIS, ROUTINE W REFLEX MICROSCOPIC - Abnormal; Notable for the following components:   Color, Urine STRAW (*)    Glucose, UA 150 (*)    Leukocytes,Ua MODERATE (*)    Bacteria, UA RARE (*)    Non Squamous Epithelial 0-5 (*)    All other components within normal limits  CBC WITH DIFFERENTIAL/PLATELET  PROTIME-INR  CBC  POC OCCULT BLOOD, ED    EKG EKG Interpretation  Date/Time:  Tuesday January 29 2021 21:37:50 EDT Ventricular Rate:  109 PR Interval:  168 QRS Duration: 139 QT Interval:  381 QTC Calculation: 514 R Axis:   43 Text Interpretation: Sinus tachycardia with irregular rate Left bundle branch block No significant change since last tracing Confirmed by Melene Plan [phone]) on 01/29/2021 9:40:58 PM  Radiology No results found.  Procedures Procedures   Medications Ordered in ED Medications - No data to display  ED Course  I have reviewed the triage vital signs and the nursing notes.  Pertinent  labs & imaging results that were available during my care of the patient were reviewed by me and considered in my medical decision making (see chart for details).    MDM Rules/Calculators/A&P                          55 yo F with a chief complaints of dark and tarry stools going on for the past 3 to 4 days.  She went to urgent care who then urged her to come here.  Patient is tachycardic here blood pressure is  unremarkable, her hemoglobin is actually normal.  I reviewed the patient's old records and she did have a significant upper GI bleed that caused her hemoglobin to drop into the fives.  She had passed out before coming to the hospital that time.  I had a discussion with her about her results.  She is a bit anxious and feels like she should get an urgent endoscopy.  I discussed with her if she is only having 1 bowel movement a day and is stable hemoglobin then typically she would not get an urgent endoscopy and would be followed up as an outpatient.  I will recheck another hemoglobin on her as she is quite tachycardic.  Hemoglobin remained stable.  I discussed results with the patient and she tells me that she typically is very tachycardic when she comes to the doctor's office.  She is not really sure why she thinks because she gets nervous.  Does not seem to have been her at home.  She was on the monitor for short period of time has been having some runs of more rapid heart rate appears to be sinus.  I did discuss further observation with the patient's.  She would prefer to be discharged at this time and will attempt to get in touch with her GI doctor in the morning.  11:11 PM:  I have discussed the diagnosis/risks/treatment options with the patient and family and believe the pt to be eligible for discharge home to follow-up with GI. We also discussed returning to the ED immediately if new or worsening sx occur. We discussed the sx which are most concerning (e.g., sudden worsening pain, fever,  inability to tolerate by mouth, diarrhea, syncope) that necessitate immediate return. Medications administered to the patient during their visit and any new prescriptions provided to the patient are listed below.  Medications given during this visit Medications - No data to display   The patient appears reasonably screen and/or stabilized for discharge and I doubt any other medical condition or other [facility] requiring further screening, evaluation, or treatment in the ED at this time prior to discharge.   Final Clinical Impression(s) / ED Diagnoses Final diagnoses:  Dark stools  Frequent PVCs    Rx / DC Orders ED Discharge Orders          Ordered    ferrous sulfate 325 (65 FE) MG tablet  Daily        01/29/21 2244    pantoprazole (PROTONIX) 20 MG tablet  Daily        01/29/21 2244             Melene Plan, DO 01/29/21 2311

## 2021-01-29 NOTE — ED Triage Notes (Signed)
Pt endorses dark stool for 2-3 days, sent from UC. Reports a ulcer with scope a year ago. Denies abd pain or any daily medications. Does not want blood transfusion.

## 2021-01-29 NOTE — Discharge Instructions (Addendum)
It is not obvious that you are losing blood with your dark stools.  Your heart rate is fast and even though you feel like this is related to your anxiety upon coming to the doctor's office something that you should have your family doctor see you again for them do an EKG.  They may want to have an ultrasound of your heart performed or perhaps send you off to the cardiologist to have them evaluated.  I did prescribe you the iron pills and the Protonix that you are on previously.  Please call your GI doctor in the morning and see when they can see you in the office.  Please return to the emergency department for chest pain shortness of breath multiple dark bowel movements a day or if you feel lightheaded dizzy like he may pass out.

## 2021-10-09 ENCOUNTER — Other Ambulatory Visit: Payer: Self-pay

## 2021-10-09 ENCOUNTER — Inpatient Hospital Stay (HOSPITAL_COMMUNITY)
Admission: EM | Admit: 2021-10-09 | Discharge: 2021-10-14 | DRG: 287 | Disposition: A | Discharge status: Home / Self Care | Payer: 59 | Source: Ambulatory Visit | Attending: Internal Medicine | Admitting: Internal Medicine

## 2021-10-09 ENCOUNTER — Inpatient Hospital Stay (HOSPITAL_COMMUNITY): Payer: 59

## 2021-10-09 ENCOUNTER — Emergency Department (HOSPITAL_COMMUNITY): Payer: 59

## 2021-10-09 ENCOUNTER — Encounter (HOSPITAL_COMMUNITY): Payer: Self-pay

## 2021-10-09 DIAGNOSIS — I5023 Acute on chronic systolic (congestive) heart failure: Principal | ICD-10-CM | POA: Diagnosis present

## 2021-10-09 DIAGNOSIS — Z79899 Other long term (current) drug therapy: Secondary | ICD-10-CM

## 2021-10-09 DIAGNOSIS — Z8249 Family history of ischemic heart disease and other diseases of the circulatory system: Secondary | ICD-10-CM | POA: Diagnosis not present

## 2021-10-09 DIAGNOSIS — J810 Acute pulmonary edema: Secondary | ICD-10-CM | POA: Diagnosis present

## 2021-10-09 DIAGNOSIS — J90 Pleural effusion, not elsewhere classified: Secondary | ICD-10-CM

## 2021-10-09 DIAGNOSIS — I428 Other cardiomyopathies: Secondary | ICD-10-CM | POA: Diagnosis present

## 2021-10-09 DIAGNOSIS — K279 Peptic ulcer, site unspecified, unspecified as acute or chronic, without hemorrhage or perforation: Secondary | ICD-10-CM | POA: Diagnosis present

## 2021-10-09 DIAGNOSIS — J302 Other seasonal allergic rhinitis: Secondary | ICD-10-CM | POA: Diagnosis present

## 2021-10-09 DIAGNOSIS — K219 Gastro-esophageal reflux disease without esophagitis: Secondary | ICD-10-CM | POA: Diagnosis present

## 2021-10-09 DIAGNOSIS — I447 Left bundle-branch block, unspecified: Secondary | ICD-10-CM | POA: Diagnosis present

## 2021-10-09 DIAGNOSIS — I509 Heart failure, unspecified: Secondary | ICD-10-CM | POA: Diagnosis not present

## 2021-10-09 DIAGNOSIS — I5021 Acute systolic (congestive) heart failure: Secondary | ICD-10-CM | POA: Diagnosis not present

## 2021-10-09 DIAGNOSIS — I471 Supraventricular tachycardia: Secondary | ICD-10-CM | POA: Diagnosis not present

## 2021-10-09 DIAGNOSIS — Z20822 Contact with and (suspected) exposure to covid-19: Secondary | ICD-10-CM | POA: Diagnosis present

## 2021-10-09 DIAGNOSIS — E876 Hypokalemia: Secondary | ICD-10-CM | POA: Diagnosis not present

## 2021-10-09 DIAGNOSIS — R000 Tachycardia, unspecified: Secondary | ICD-10-CM | POA: Diagnosis not present

## 2021-10-09 LAB — BASIC METABOLIC PANEL
Anion gap: 8 (ref 5–15)
BUN: 16 mg/dL (ref 6–20)
CO2: 21 mmol/L — ABNORMAL LOW (ref 22–32)
Calcium: 8.8 mg/dL — ABNORMAL LOW (ref 8.9–10.3)
Chloride: 111 mmol/L (ref 98–111)
Creatinine, Ser: 1.03 mg/dL — ABNORMAL HIGH (ref 0.44–1.00)
GFR, Estimated: >60 mL/min (ref >60)
Glucose, Bld: 115 mg/dL — ABNORMAL HIGH (ref 70–99)
Potassium: 4 mmol/L (ref 3.5–5.1)
Sodium: 140 mmol/L (ref 135–145)

## 2021-10-09 LAB — TROPONIN I (HIGH SENSITIVITY)
Troponin I (High Sensitivity): 14 ng/L (ref <18)
Troponin I (High Sensitivity): 15 ng/L (ref <18)

## 2021-10-09 LAB — MRSA NEXT GEN BY PCR, NASAL: MRSA by PCR Next Gen: NOT DETECTED

## 2021-10-09 LAB — HIV ANTIBODY (ROUTINE TESTING W REFLEX): HIV Screen 4th Generation wRfx: NONREACTIVE

## 2021-10-09 LAB — BRAIN NATRIURETIC PEPTIDE: B Natriuretic Peptide: 437.3 pg/mL — ABNORMAL HIGH (ref 0.0–100.0)

## 2021-10-09 LAB — CBC
HCT: 40 % (ref 36.0–46.0)
Hemoglobin: 13.3 g/dL (ref 12.0–15.0)
MCH: 28.1 pg (ref 26.0–34.0)
MCHC: 33.3 g/dL (ref 30.0–36.0)
MCV: 84.6 fL (ref 80.0–100.0)
Platelets: 296 10*3/uL (ref 150–400)
RBC: 4.73 MIL/uL (ref 3.87–5.11)
RDW: 14.4 % (ref 11.5–15.5)
WBC: 6.9 10*3/uL (ref 4.0–10.5)
nRBC: 0 % (ref 0.0–0.2)

## 2021-10-09 LAB — CREATININE, SERUM
Creatinine, Ser: 0.9 mg/dL (ref 0.44–1.00)
GFR, Estimated: >60 mL/min (ref >60)

## 2021-10-09 LAB — RESP PANEL BY RT-PCR (FLU A&B, COVID) ARPGX2
Influenza A by PCR: NEGATIVE
Influenza B by PCR: NEGATIVE
SARS Coronavirus 2 by RT PCR: NEGATIVE

## 2021-10-09 LAB — I-STAT BETA HCG BLOOD, ED (MC, WL, AP ONLY): I-stat hCG, quantitative: <5 m[IU]/mL (ref <5)

## 2021-10-09 LAB — TSH: TSH: 1.036 u[IU]/mL (ref 0.350–4.500)

## 2021-10-09 MED ORDER — ONDANSETRON HCL 4 MG/2ML IJ SOLN: 4.0000 mg | Freq: Four times a day (QID) | INTRAMUSCULAR | Status: DC | PRN

## 2021-10-09 MED ORDER — DIGOXIN 0.25 MG/ML IJ SOLN
0.2500 mg | Freq: Once | INTRAMUSCULAR | Status: AC
  Administered 2021-10-09: 0.25 mg via INTRAVENOUS
  Filled 2021-10-09: qty 1

## 2021-10-09 MED ORDER — BENZONATATE 100 MG PO CAPS: 100.0000 mg | ORAL_CAPSULE | Freq: Three times a day (TID) | ORAL | Status: DC | PRN

## 2021-10-09 MED ORDER — FLUTICASONE PROPIONATE 50 MCG/ACT NA SUSP
2.0000 | Freq: Every day | NASAL | Status: DC
  Administered 2021-10-09 – 2021-10-14 (×6): 2 via NASAL
  Filled 2021-10-09: qty 16

## 2021-10-09 MED ORDER — ENOXAPARIN SODIUM 40 MG/0.4ML IJ SOSY
40.0000 mg | PREFILLED_SYRINGE | INTRAMUSCULAR | Status: DC
  Administered 2021-10-09 – 2021-10-13 (×5): 40 mg via SUBCUTANEOUS
  Filled 2021-10-09 (×5): qty 0.4

## 2021-10-09 MED ORDER — PANTOPRAZOLE SODIUM 20 MG PO TBEC
20.0000 mg | DELAYED_RELEASE_TABLET | Freq: Every day | ORAL | Status: DC
  Administered 2021-10-09 – 2021-10-14 (×6): 20 mg via ORAL
  Filled 2021-10-09 (×6): qty 1

## 2021-10-09 MED ORDER — FUROSEMIDE 10 MG/ML IJ SOLN
40.0000 mg | Freq: Once | INTRAMUSCULAR | Status: AC
Start: 2021-10-09 — End: 2021-10-09
  Administered 2021-10-09: 40 mg via INTRAVENOUS
  Filled 2021-10-09: qty 4

## 2021-10-09 MED ORDER — ALPRAZOLAM 0.25 MG PO TABS: 0.2500 mg | ORAL_TABLET | Freq: Two times a day (BID) | ORAL | Status: DC | PRN

## 2021-10-09 MED ORDER — SODIUM CHLORIDE 0.9 % IV SOLN: 250.0000 mL | INTRAVENOUS | Status: DC | PRN

## 2021-10-09 MED ORDER — LISINOPRIL 5 MG PO TABS
5.0000 mg | ORAL_TABLET | Freq: Every day | ORAL | Status: DC
  Administered 2021-10-09: 5 mg via ORAL
  Filled 2021-10-09: qty 1

## 2021-10-09 MED ORDER — ASPIRIN EC 81 MG PO TBEC
81.0000 mg | DELAYED_RELEASE_TABLET | Freq: Every day | ORAL | Status: DC
  Administered 2021-10-09: 81 mg via ORAL
  Filled 2021-10-09: qty 1

## 2021-10-09 MED ORDER — TRAZODONE HCL 50 MG PO TABS: 25.0000 mg | ORAL_TABLET | Freq: Every evening | ORAL | Status: DC | PRN

## 2021-10-09 MED ORDER — ACETAMINOPHEN 325 MG PO TABS: 650.0000 mg | ORAL_TABLET | ORAL | Status: DC | PRN

## 2021-10-09 MED ORDER — SODIUM CHLORIDE 0.9% FLUSH: 3.0000 mL | INTRAVENOUS | Status: DC | PRN

## 2021-10-09 MED ORDER — FUROSEMIDE 10 MG/ML IJ SOLN
40.0000 mg | Freq: Two times a day (BID) | INTRAMUSCULAR | Status: DC
  Administered 2021-10-10 – 2021-10-11 (×3): 40 mg via INTRAVENOUS
  Filled 2021-10-09 (×3): qty 4

## 2021-10-09 MED ORDER — IOHEXOL 350 MG/ML SOLN
65.0000 mL | Freq: Once | INTRAVENOUS | Status: AC | PRN
Start: 2021-10-09 — End: 2021-10-09
  Administered 2021-10-09: 65 mL via INTRAVENOUS

## 2021-10-09 MED ORDER — MAGNESIUM HYDROXIDE 400 MG/5ML PO SUSP: 30.0000 mL | Freq: Every day | ORAL | Status: DC | PRN

## 2021-10-09 MED ORDER — FLUTICASONE FUROATE 27.5 MCG/SPRAY NA SUSP: 2.0000 | Freq: Every day | NASAL | Status: DC

## 2021-10-09 MED ORDER — SODIUM CHLORIDE 0.9% FLUSH
3.0000 mL | Freq: Two times a day (BID) | INTRAVENOUS | Status: DC
  Administered 2021-10-09 – 2021-10-11 (×3): 3 mL via INTRAVENOUS

## 2021-10-09 NOTE — Assessment & Plan Note (Addendum)
Continue with nasal steroids for now.  ?

## 2021-10-09 NOTE — Assessment & Plan Note (Addendum)
Acute cardiogenic pulmonary edema.  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  ? ?Patient was admitted to the cardiac ward and was placed on IV furosemide for diuresis, negative fluid balance was achieved, -4.210 ml, with significant improvement of her symptoms.  ? ?Further work up with: ?Echocardiogram with LV EF 20 to 25%, with global hypokinesis, elevated end diastolic pressure, RV with preserved systolic function, moderate left atrial dilatation. No significant valvular disease.  ? ?Cardiac catheterization with no significant coronary artery disease, normal right dominant circulation. Cardiac output 11 L./min and index 6/L, wedge pressure 7 and LVEDP 11. ?Ejection fraction left ventricle 30%.  ? ? ?Patient will continue heart failure management with metoprolol succinate, dapagliflozin and spironolactone.  ?Daily furosemide to keep negative fluid balance.  ?Plan to start patient on Entresto as outpatient, currently on hold to prevent hypotension.  ? ?Because persistent tachycardia metoprolol has been up titrated to 50 mg daily and ivabradine for better heart rate control.  ?

## 2021-10-09 NOTE — ED Triage Notes (Signed)
Pt was sent from UC with c/o cough, SOB and chest pain, recently diagnosed with PNA and finished course of ABX. UC sent her here to be evaluated for early heart failure. ?

## 2021-10-09 NOTE — Assessment & Plan Note (Deleted)
-   We will obtain a chest CTA to assess for PE especially given her tachypnea. ?- Chest CT should tell us about pneumonia as well although clinically it seems like acute CHF. ?- I gave her 1 dose of 0.25 mg of IV digoxin. ?

## 2021-10-09 NOTE — Assessment & Plan Note (Addendum)
Continue with pantoprazole. ?No clinical signs of recurrent bleeding.  ?

## 2021-10-09 NOTE — Plan of Care (Signed)

## 2021-10-09 NOTE — ED Provider Notes (Signed)
?[place] ?Provider Note ? ? ?CSN: [id number] ?Arrival date & time: 10/09/21  1730 ? ?  ? ?History ? ?Chief Complaint  ?Patient presents with  ? Shortness of Breath  ? ? ?Lori Mcbride is a 56 y.o. female.  Presented to the emergency room with concern for shortness of breath.  Has been having some symptoms over the past 2 or 3 weeks.  Shortness of breath, cough.  Went to an urgent care and was initially told that she was having seasonal allergies and given some medication.  This medication did not improve.  6 days ago she went back to the urgent care and was told that she may have pneumonia and was given a course of doxycycline.  States that she has completed this but no improvement in symptoms.  Today primarily complaining of shortness of breath, also having slight chest discomfort on the left side.  Breathing is worse with exertion, improved with rest. ? ?History is primarily obtained from patient but daughter at bedside provides some additional history. ? ?Per review of chart, last discharge summary, admission for acute blood loss anemia secondary to acute upper GI bleed from gastric ulcer.  Jehovah's Witness. ? ?HPI ? ?  ? ?Home Medications ?Prior to Admission medications   ?Medication Sig Start Date End Date Taking? Authorizing Provider  ?benzonatate (TESSALON) 100 MG capsule Take by mouth 3 (three) times daily as needed for cough.   Yes [provider]  ?fluticasone (VERAMYST) 27.5 MCG/SPRAY nasal spray Place 2 sprays into the nose daily.   Yes [provider]  ?promethazine-dextromethorphan (PROMETHAZINE-DM) 6.25-15 MG/5ML syrup Take 5 mLs by mouth 4 (four) times daily as needed for cough.   Yes [provider]  ?ferrous sulfate 325 (65 FE) MG tablet Take 1 tablet (325 mg total) by mouth daily. ?Patient not taking: Reported on 10/09/2021 01/29/21   Deno Etienne, DO  ?pantoprazole (PROTONIX) 20 MG tablet Take 1 tablet (20 mg total) by mouth daily.  01/29/21 02/28/21  Deno Etienne, DO  ?   ? ?Allergies    ?Patient has no known allergies.   ? ?Review of Systems   ?Review of Systems  ?Respiratory:  Positive for cough and shortness of breath.   ?All other systems reviewed and are negative. ? ?Physical Exam ?Updated Vital Signs ?BP 105/86   Pulse (!) 124   Temp 98.4 ?F (36.9 ?C) (Oral)   Resp (!) 22   Ht 5\' 4"  (1.626 m)   Wt 86.2 kg   LMP 09/05/2021 (Approximate)   SpO2 96%   BMI 32.61 kg/m?  ?Physical Exam ?Vitals and nursing note reviewed.  ?Constitutional:   ?   General: She is not in acute distress. ?   Appearance: She is well-developed.  ?HENT:  ?   Head: Normocephalic and atraumatic.  ?Eyes:  ?   Conjunctiva/sclera: Conjunctivae normal.  ?Cardiovascular:  ?   Rate and Rhythm: Regular rhythm. Tachycardia present.  ?   Heart sounds: No murmur heard. ?Pulmonary:  ?   Comments: Mild tachypnea but no respiratory distress, noted mild crackles in both lung bases ?Abdominal:  ?   Palpations: Abdomen is soft.  ?   Tenderness: There is no abdominal tenderness.  ?Musculoskeletal:     ?   General: No swelling.  ?   Cervical back: Neck supple.  ?Skin: ?   General: Skin is warm and dry.  ?   Capillary Refill: Capillary refill takes less than 2 seconds.  ?Neurological:  ?  Mental Status: She is alert.  ?Psychiatric:     ?   Mood and Affect: Mood normal.  ? ? ?ED Results / Procedures / Treatments   ?Labs ?(all labs ordered are listed, but only abnormal results are displayed) ?Labs Reviewed  ?BASIC METABOLIC PANEL - Abnormal; Notable for the following components:  ?    Result Value  ? CO2 21 (*)   ? Glucose, Bld 115 (*)   ? Creatinine, Ser 1.03 (*)   ? Calcium 8.8 (*)   ? All other components within normal limits  ?BRAIN NATRIURETIC PEPTIDE - Abnormal; Notable for the following components:  ? B Natriuretic Peptide 437.3 (*)   ? All other components within normal limits  ?RESP PANEL BY RT-PCR (FLU A&B, COVID) ARPGX2  ?CBC  ?TSH  ?I-STAT BETA HCG BLOOD, ED ([facility], [facility], [facility]  ONLY)  ?TROPONIN I (HIGH SENSITIVITY)  ? ? ?EKG ?EKG Interpretation ? ?Date/Time:  Wednesday October 09 2021 17:25:45 EDT ?Ventricular Rate:  137 ?PR Interval:  104 ?QRS Duration: 130 ?QT Interval:  330 ?QTC Calculation: 498 ?R Axis:   99 ?Text Interpretation: Sinus tachycardia with short PR Rightward axis Non-specific intra-ventricular conduction block Cannot rule out Anterior infarct , age undetermined T wave abnormality, consider inferolateral ischemia Abnormal ECG When compared with ECG of 29-Jan-2021 21:37, No significant change since Confirmed by Madalyn Rob [phone]) on 10/09/2021 6:56:52 PM ? ?Radiology ?DG Chest Portable 1 View ? ?Result Date: 10/09/2021 ?CLINICAL DATA:  Shortness of breath, chest pain EXAM: PORTABLE CHEST 1 VIEW COMPARISON:  01/10/2020 FINDINGS: Transverse diameter of heart is increased. There is abnormal prominence of central pulmonary vessels. Increased interstitial and alveolar markings are seen in the parahilar regions and lower lung fields. There is blunting of both lateral CP angles. There is no pneumothorax. IMPRESSION: Cardiomegaly. CHF. Pulmonary edema. Possibility of underlying pneumonia is not excluded. Follow-up chest radiographs should be considered to assess resolution. Small bilateral pleural effusions. Electronically Signed   By: Elmer Picker M.D.   On: 10/09/2021 18:43   ? ?Procedures ?Ultrasound ED Echo ? ?Date/Time: 10/09/2021 6:59 PM ?Performed by: Lucrezia Starch, MD ?Authorized by: Lucrezia Starch, MD  ? ?Procedure details:  ?  Indications: dyspnea   ?  Views: subxiphoid, parasternal long axis view, parasternal short axis view and apical 4 chamber view   ?  Images: archived   ?  Limitations:  Body habitus ?Findings:  ?  Pericardium: no pericardial effusion   ?  Cardiac Activity comment:  Grossly decreased cardiac activity ?  LV Function: depressed (30 - 50%)   ?  RV Diameter: normal   ?Impression:  ?  Impression comment:  At least moderately depressed  Ejection Fraction, no signs of RV strain, no pericardial effusion, small bilateral pleural effusion noted  ? ? ?Medications Ordered in ED ?Medications  ?furosemide (LASIX) injection 40 mg (has no administration in time range)  ? ? ?ED Course/ Medical Decision Making/ A&P ?  ?                        ?Medical Decision Making ?Amount and/or Complexity of Data Reviewed ?Radiology: ordered. ? ?Risk ?Prescription drug management. ? ? ?56 year old lady presenting to the emergency room with concern for shortness of breath.  Patient seen in urgent care and treated for allergies versus pneumonia.  No improvement with antibiotics and presented to ER today with ongoing shortness of breath and cough.  Notably no fever, no significant cough production.  No leukocytosis.  CXR concerning for cardiomegaly, heart failure, pulmonary edema.  I performed bedside echocardiogram and patient has at least moderately if not severely reduced ejection fraction based on my bedside echo.  Patient denies prior history of heart failure.  She does not have ongoing chest pain, no obvious EKG changes and initial troponin normal.  BNP is quite elevated which is consistent with heart failure.  Feel patient would benefit from admission for formal echocardiogram, IV diuresis and further observation and further testing given suspected new onset heart failure. ? ?Patient and daughter updated throughout stay. ? ?Will consult unassigned medicine for admission. ? ? ? ? ? ? ? ?Final Clinical Impression(s) / ED Diagnoses ?Final diagnoses:  ?Heart failure, unspecified HF chronicity, unspecified heart failure type ([place])  ?Acute pulmonary edema ([facility])  ?Pleural effusion  ? ? ?Rx / DC Orders ?ED Discharge Orders   ? ? None  ? ?  ? ? ?  ?Lucrezia Starch, MD ?10/09/21 1902 ? ?

## 2021-10-09 NOTE — ED Provider Triage Note (Signed)
Emergency Medicine Provider Triage Evaluation Note ? ?Berkleigh Beckles , a 56 y.o. female  was evaluated in triage.  Pt complains of shortness of breath, cough and chest pain for a week. She was diagnosed with pneumonia at urgent care but sent here because CXR looked concerning for heart failure.  ? ?Review of Systems  ?Positive: Chest pain, SOB, cough ?Negative: Abdominal pain, leg pain or swelling ? ?Physical Exam  ?BP 121/76 (BP Location: Left Arm)   Pulse (!) 140   Temp 98.4 ?F (36.9 ?C) (Oral)   Resp 18   Ht 5\' 4"  (1.626 m)   Wt 86.2 kg   LMP 09/05/2021 (Approximate)   SpO2 98%   BMI 32.61 kg/m?  ?Gen:   Awake, no distress   ?Resp:  Normal effort  ?MSK:   Moves extremities without difficulty  ?Other:   ? ?Medical Decision Making  ?Medically screening exam initiated at 5:46 PM.  Appropriate orders placed.  Zacaria Pousson was informed that the remainder of the evaluation will be completed by another provider, this initial triage assessment does not replace that evaluation, and the importance of remaining in the ED until their evaluation is complete. ? ? ?  ?Matthew Folks, PA-C ?10/09/21 1752 ? ?

## 2021-10-09 NOTE — ED Notes (Addendum)
Pt ambulated with steady gait to restroom, on return, pt appeared SOB with increased coughing (dry), saturations 96% on RA, increased HR in the 130's and tachypnea. Improved with rest.  ?

## 2021-10-09 NOTE — H&P (Addendum)
?  ?  ?[place] ? ? ?PATIENT NAME: Lori Mcbride   ? ?MR#:  [id number] ? ?DATE OF BIRTH:  Sep 13, 1965 ? ?DATE OF ADMISSION:  10/09/2021 ? ?PRIMARY CARE PHYSICIAN: Pcp, No  ? ?Patient is coming from: Home ? ?REQUESTING/REFERRING PHYSICIAN: Lucrezia Starch, MD  ? ?CHIEF COMPLAINT:  ? ?Chief Complaint  ?Patient presents with  ?? Shortness of Breath  ? ? ?HISTORY OF PRESENT ILLNESS:  ?Lori Mcbride is a 56 y.o. Hispanic female with medical history significant for GI bleeding and GERD, who presented to the emergency room with acute onset of worsening shortness of breath over the last 2 to 3 weeks with dry cough.  She was seen in urgent care and was initially told that her symptoms could be related to seasonal allergies for which she was given Tessalon initially and later  promethazine dextromethorphan syrup as well as Flonase nasal spray which she has been using without significant improvement.  About a week ago she went to urgent care and was told she may have pneumonia and was given p.o. doxycycline which she has completed again without improvement of her symptoms.  She admits to having mild left-sided chest discomfort felt as pressure with no radiation and her dyspnea has been worse with exertion and improving with rest. ? ?She admits to 3 pillow orthopnea that is recent, paroxysmal nocturnal dyspnea and mild lower extremity edema as well as dry cough.  She has been having urinary frequency without dysuria or hematuria or flank pain.  No diarrhea or melena or bright red bleeding per rectum.  She denies any abdominal pain but has been having occasional nausea without vomiting the patient is a Sales promotion account executive Witness. ? ?ED Course: When she came to the ER, heart rate was 140 with otherwise normal vital signs.  Later on heart rate was 110 and respiratory rate was 22 then 25.  Labs revealed a creatinine of 1 and a CO2 of 21 with a BNP of 437.3.  High-sensitivity troponin I was 14 and later 15 CBC was within normal.  Quantitative  hCG was negative. ?EKG as reviewed by me : EKG showed sinus tachycardia with short PR interval and a rate of 137 with right axis deviation and inferolateral ischemia with ST segment depression. ?Imaging: Chest x-ray showed cardiomegaly with pulmonary edema concerning for CHF with inability to exclude pneumonia.  It showed small bilateral pleural effusions and recommendation was for follow-up chest radiographs to assess resolution. ? ?The patient was given 40 mg of IV Lasix.  She will be admitted to a cardiac telemetry bed for further evaluation and management. ?PAST MEDICAL HISTORY:  ? ?Past Medical History:  ?Diagnosis Date  ?? GI bleeding 12/2019  ?? Knee pain, right   ?? Refusal of blood product   ?? Refusal of blood transfusions as patient is Jehovah's Witness   ? ? ?PAST SURGICAL HISTORY:  ? ?Past Surgical History:  ?Procedure Laterality Date  ?? APPENDECTOMY    ?? ESOPHAGOGASTRODUODENOSCOPY N/A 01/11/2020  ? Procedure: ESOPHAGOGASTRODUODENOSCOPY (EGD);  Surgeon: Wonda Horner, MD;  Location: [facility] ENDOSCOPY;  Service: Endoscopy;  Laterality: N/A;  ?? HEMOSTASIS CLIP PLACEMENT  01/11/2020  ? Procedure: HEMOSTASIS CLIP PLACEMENT;  Surgeon: Wonda Horner, MD;  Location: [facility] ENDOSCOPY;  Service: Endoscopy;;  ?? HEMOSTASIS CONTROL  01/11/2020  ? Procedure: HEMOSTASIS CONTROL;  Surgeon: Wonda Horner, MD;  Location: [facility] ENDOSCOPY;  Service: Endoscopy;;  epinephrine injection  ? ? ?SOCIAL HISTORY:  ? ?Social History  ? ?Tobacco Use  ??  Smoking status: Never  ?? Smokeless tobacco: Never  ?Substance Use Topics  ?? Alcohol use: Never  ? ? ?FAMILY HISTORY:  ?No family history on file.  She denied any familial diseases. ? ?DRUG ALLERGIES:  ?No Known Allergies ? ?REVIEW OF SYSTEMS:  ? ?ROS ?As per history of present illness. All pertinent systems were reviewed above. Constitutional, HEENT, cardiovascular, respiratory, GI, GU, musculoskeletal, neuro, psychiatric, endocrine, integumentary and hematologic systems were reviewed and  are otherwise negative/unremarkable except for positive findings mentioned above in the HPI. ? ? ?MEDICATIONS AT HOME:  ? ?Prior to Admission medications   ?Medication Sig Start Date End Date Taking? Authorizing Provider  ?benzonatate (TESSALON) 100 MG capsule Take by mouth 3 (three) times daily as needed for cough.   Yes [provider]  ?fluticasone (VERAMYST) 27.5 MCG/SPRAY nasal spray Place 2 sprays into the nose daily.   Yes [provider]  ?promethazine-dextromethorphan (PROMETHAZINE-DM) 6.25-15 MG/5ML syrup Take 5 mLs by mouth 4 (four) times daily as needed for cough.   Yes [provider]  ?ferrous sulfate 325 (65 FE) MG tablet Take 1 tablet (325 mg total) by mouth daily. ?Patient not taking: Reported on 10/09/2021 01/29/21   Deno Etienne, DO  ?pantoprazole (PROTONIX) 20 MG tablet Take 1 tablet (20 mg total) by mouth daily. 01/29/21 02/28/21  Deno Etienne, DO  ? ?  ? ?VITAL SIGNS:  ?Blood pressure 104/84, pulse 87, temperature 98.3 ?F (36.8 ?C), temperature source Oral, resp. rate 15, height 5\' 4"  (1.626 m), weight 87.8 kg, last menstrual period 09/05/2021, SpO2 93 %. ? ?PHYSICAL EXAMINATION:  ?Physical Exam ? ?GENERAL:  56 y.o.-year-old female patient lying in the bed with no acute distress.  ?EYES: Pupils equal, round, reactive to light and accommodation. No scleral icterus. Extraocular muscles intact.  ?HEENT: Head atraumatic, normocephalic. Oropharynx and nasopharynx clear.  ?NECK:  Supple, no jugular venous distention. No thyroid enlargement, no tenderness.  ?LUNGS: Diminished bibasilar breath sounds with bibasal rales. ?CARDIOVASCULAR: Regular rate and rhythm, S1, S2 normal. No murmurs, rubs, or gallops.  ?ABDOMEN: Soft, nondistended, nontender. Bowel sounds present. No organomegaly or mass.  ?EXTREMITIES: Trace to 1+ bilateral lower extremity pitting edema with no cyanosis, or clubbing.  ?NEUROLOGIC: Cranial nerves II through XII are intact. Muscle strength 5/5 in all extremities.  Sensation intact. Gait not checked.  ?PSYCHIATRIC: The patient is alert and oriented x 3.  Normal affect and good eye contact. ?SKIN: No obvious rash, lesion, or ulcer.  ? ?LABORATORY PANEL:  ? ?CBC ?Recent Labs  ?Lab 10/10/21 ?0106  ?WBC 5.8  ?HGB 12.4  ?HCT 37.4  ?PLT 279  ? ?------------------------------------------------------------------------------------------------------------------ ? ?Chemistries  ?Recent Labs  ?Lab 10/10/21 ?0106  ?NA 140  ?K 3.6  ?CL 108  ?CO2 24  ?GLUCOSE 107*  ?BUN 14  ?CREATININE 0.80  ?CALCIUM 8.6*  ? ?------------------------------------------------------------------------------------------------------------------ ? ?Cardiac Enzymes ?No results for input(s): TROPONINI in the last 168 hours. ?------------------------------------------------------------------------------------------------------------------ ? ?RADIOLOGY:  ?CT Angio Chest Pulmonary Embolism (PE) W or WO Contrast ? ?Result Date: 10/09/2021 ?CLINICAL DATA:  Pulmonary embolism (PE) suspected, unknown D-dimer Chest pain and shortness of breath. EXAM: CT ANGIOGRAPHY CHEST WITH CONTRAST TECHNIQUE: Multidetector CT imaging of the chest was performed using the standard protocol during bolus administration of intravenous contrast. Multiplanar CT image reconstructions and MIPs were obtained to evaluate the vascular anatomy. RADIATION DOSE REDUCTION: This exam was performed according to the departmental dose-optimization program which includes automated exposure control, adjustment of the mA and/or kV according to patient size and/or use  of iterative reconstruction technique. CONTRAST:  26mL OMNIPAQUE IOHEXOL 350 MG/ML SOLN COMPARISON:  Radiograph earlier today. FINDINGS: Cardiovascular: There are no filling defects within the pulmonary arteries to suggest pulmonary embolus. Multi chamber cardiomegaly. Contrast refluxes into the hepatic veins and IVC. The thoracic aorta is normal in caliber. Mediastinum/Nodes: Shotty mediastinal and  hilar lymph nodes are typically reactive. Calcified right hilar nodes consistent with prior granulomatous disease. Patulous esophagus without wall thickening. No suspicious thyroid nodule. Lungs/Ple

## 2021-10-10 ENCOUNTER — Encounter (HOSPITAL_COMMUNITY): Payer: Self-pay | Admitting: Family Medicine

## 2021-10-10 ENCOUNTER — Inpatient Hospital Stay (HOSPITAL_COMMUNITY): Payer: 59

## 2021-10-10 DIAGNOSIS — K279 Peptic ulcer, site unspecified, unspecified as acute or chronic, without hemorrhage or perforation: Secondary | ICD-10-CM | POA: Diagnosis not present

## 2021-10-10 DIAGNOSIS — J302 Other seasonal allergic rhinitis: Secondary | ICD-10-CM | POA: Diagnosis not present

## 2021-10-10 DIAGNOSIS — I5021 Acute systolic (congestive) heart failure: Secondary | ICD-10-CM | POA: Diagnosis not present

## 2021-10-10 DIAGNOSIS — I509 Heart failure, unspecified: Secondary | ICD-10-CM | POA: Diagnosis not present

## 2021-10-10 LAB — CBC
HCT: 37.4 % (ref 36.0–46.0)
Hemoglobin: 12.4 g/dL (ref 12.0–15.0)
MCH: 27.7 pg (ref 26.0–34.0)
MCHC: 33.2 g/dL (ref 30.0–36.0)
MCV: 83.7 fL (ref 80.0–100.0)
Platelets: 279 10*3/uL (ref 150–400)
RBC: 4.47 MIL/uL (ref 3.87–5.11)
RDW: 14.2 % (ref 11.5–15.5)
WBC: 5.8 10*3/uL (ref 4.0–10.5)
nRBC: 0 % (ref 0.0–0.2)

## 2021-10-10 LAB — BASIC METABOLIC PANEL
Anion gap: 8 (ref 5–15)
BUN: 14 mg/dL (ref 6–20)
CO2: 24 mmol/L (ref 22–32)
Calcium: 8.6 mg/dL — ABNORMAL LOW (ref 8.9–10.3)
Chloride: 108 mmol/L (ref 98–111)
Creatinine, Ser: 0.8 mg/dL (ref 0.44–1.00)
GFR, Estimated: >60 mL/min (ref >60)
Glucose, Bld: 107 mg/dL — ABNORMAL HIGH (ref 70–99)
Potassium: 3.6 mmol/L (ref 3.5–5.1)
Sodium: 140 mmol/L (ref 135–145)

## 2021-10-10 LAB — ECHOCARDIOGRAM COMPLETE
AR max vel: 2.15 cm2
AV Area VTI: 1.93 cm2
AV Area mean vel: 1.93 cm2
AV Mean grad: 3 mmHg
AV Peak grad: 4.9 mmHg
Ao pk vel: 1.11 m/s
Area-P 1/2: 5.2 cm2
Height: 64 in
S' Lateral: 6.1 cm
Weight: 2984.15 oz

## 2021-10-10 MED ORDER — SODIUM CHLORIDE 0.9% FLUSH
3.0000 mL | Freq: Two times a day (BID) | INTRAVENOUS | Status: DC
  Administered 2021-10-11 – 2021-10-12 (×3): 3 mL via INTRAVENOUS

## 2021-10-10 NOTE — Plan of Care (Signed)

## 2021-10-10 NOTE — H&P (View-Only) (Signed)
?Cardiology Consultation:  ? ?Patient ID: Lori Mcbride ?MRN: [id number]; DOB: 19-Dec-1965 ? ?Admit date: 10/09/2021 ?Date of Consult: 10/10/2021 ? ?PCP:  Pcp, No ?  ?[facility] Providers ?Cardiologist:  None   New  ? ?Patient Profile:  ? ?Lori Mcbride is a 56 y.o. female with a hx of GERD, GI bleed who is being seen 10/10/2021 for the evaluation of CHF at the request of Dr. Cathlean Sauer. ? ?History of Present Illness:  ? ?Lori Mcbride is a 56 yo female with PMH noted above. She has never been evaluated by cardiology. She does not take any medications on a regular basis.  ? ?States she was initially seen at UC 2 weeks prior with shortness of breath and thought to have asthma/allergies. She was given an inhaler, flonase and tessalon pearls. Continued to have ongoing dyspnea and non productive cough and presented back. Then thought to have PNA and placed on doxycycline but didn't have improvement in her symptoms.  ? ?Presented back to UC yesterday with ongoing symptoms and sent to the ED for further evaluation.  ? ?In the ED her labs showed sodium 140, potassium 4, creatinine 1.03, BNP 437, high-sensitivity troponin 14>> 15, WBC 6.9, hemoglobin 13.3, TSH 1.03.  EKG showed sinus tachycardia, 137 bpm, IVCD, nonspecific T wave changes.  Chest x-ray showed cardiomegaly with pulmonary edema with concern for CHF with inability to exclude pneumonia.  Small bilateral pleural effusions.  She was admitted to internal medicine for further management.  Started on IV Lasix.  CT angio negative for PE with bilateral pleural effusions and pulmonary edema.  Echocardiogram 3/22 showed LVEF of 20 to 25% with global hypokinesis, grade 2 diastolic dysfunction, normal RV, moderately dilated left atrial size, trivial pericardial effusion.  Cardiology asked to evaluate.  ? ?In talking with patient she's had increase LE edema, orthopnea and PND. Has had to sleep on 3 pillows at night. No chest pain. She denies any family hx of CAD, CHF or arrhythmias.     ? ?Past Medical History:  ?Diagnosis Date  ? GI bleeding 12/2019  ? Knee pain, right   ? Refusal of blood product   ? Refusal of blood transfusions as patient is Jehovah's Witness   ? ? ?Past Surgical History:  ?Procedure Laterality Date  ? APPENDECTOMY    ? ESOPHAGOGASTRODUODENOSCOPY N/A 01/11/2020  ? Procedure: ESOPHAGOGASTRODUODENOSCOPY (EGD);  Surgeon: Wonda Horner, MD;  Location: [facility] ENDOSCOPY;  Service: Endoscopy;  Laterality: N/A;  ? HEMOSTASIS CLIP PLACEMENT  01/11/2020  ? Procedure: HEMOSTASIS CLIP PLACEMENT;  Surgeon: Wonda Horner, MD;  Location: [facility] ENDOSCOPY;  Service: Endoscopy;;  ? HEMOSTASIS CONTROL  01/11/2020  ? Procedure: HEMOSTASIS CONTROL;  Surgeon: Wonda Horner, MD;  Location: [facility] ENDOSCOPY;  Service: Endoscopy;;  epinephrine injection  ?  ? ?Home Medications:  ?Prior to Admission medications   ?Medication Sig Start Date End Date Taking? Authorizing Provider  ?benzonatate (TESSALON) 100 MG capsule Take by mouth 3 (three) times daily as needed for cough.   Yes [provider]  ?fluticasone (VERAMYST) 27.5 MCG/SPRAY nasal spray Place 2 sprays into the nose daily.   Yes [provider]  ?promethazine-dextromethorphan (PROMETHAZINE-DM) 6.25-15 MG/5ML syrup Take 5 mLs by mouth 4 (four) times daily as needed for cough.   Yes [provider]  ?ferrous sulfate 325 (65 FE) MG tablet Take 1 tablet (325 mg total) by mouth daily. ?Patient not taking: Reported on 10/09/2021 01/29/21   Deno Etienne, DO  ?pantoprazole (PROTONIX) 20 MG tablet Take 1  tablet (20 mg total) by mouth daily. 01/29/21 02/28/21  Deno Etienne, DO  ? ? ?Inpatient Medications: ?Scheduled Meds: ? enoxaparin (LOVENOX) injection  40 mg Subcutaneous Q24H  ? fluticasone  2 spray Each Nare Daily  ? furosemide  40 mg Intravenous Q12H  ? pantoprazole  20 mg Oral Daily  ? sodium chloride flush  3 mL Intravenous Q12H  ? ?Continuous Infusions: ? sodium chloride    ? ?PRN Meds: ?sodium chloride, acetaminophen, ALPRAZolam,  magnesium hydroxide, ondansetron (ZOFRAN) IV, sodium chloride flush, traZODone ? ?Allergies:   No Known Allergies ? ?Social History:   ?Social History  ? ?Socioeconomic History  ? Marital status: Married  ?  Spouse name: Not on file  ? Number of children: Not on file  ? Years of education: Not on file  ? Highest education level: Not on file  ?Occupational History  ? Occupation: housekeeper  ?Tobacco Use  ? Smoking status: Never  ? Smokeless tobacco: Never  ?Vaping Use  ? Vaping Use: Never used  ?Substance and Sexual Activity  ? Alcohol use: Never  ? Drug use: Never  ? Sexual activity: Not on file  ?Other Topics Concern  ? Not on file  ?Social History Narrative  ? Not on file  ? ?Social Determinants of Health  ? ?Financial Resource Strain: Not on file  ?Food Insecurity: Not on file  ?Transportation Needs: Not on file  ?Physical Activity: Not on file  ?Stress: Not on file  ?Social Connections: Not on file  ?Intimate Partner Violence: Not on file  ?  ?Family History:   ? ?Family History  ?Problem Relation Age of Onset  ? Hypertension Father   ?  ? ?ROS:  ?Please see the history of present illness.  ? ?All other ROS reviewed and negative.    ? ?Physical Exam/Data:  ? ?Vitals:  ? 10/10/21 0529 10/10/21 0734 10/10/21 1109 10/10/21 1607  ?BP: 102/76 95/72 97/74  100/70  ?Pulse: 98 98 98 98  ?Resp: 20 18 20 20   ?Temp: 98.7 ?F (37.1 ?C) 98.3 ?F (36.8 ?C) 98.4 ?F (36.9 ?C) 98.3 ?F (36.8 ?C)  ?TempSrc: Oral Oral Oral Oral  ?SpO2: 96% 95% 92% 95%  ?Weight: 84.6 kg     ?Height:      ? ? ?Intake/Output Summary (Last 24 hours) at 10/10/2021 1711 ?Last data filed at 10/10/2021 1053 ?Gross per 24 hour  ?Intake 660 ml  ?Output 4450 ml  ?Net -3790 ml  ? ? ?  10/10/2021  ?  5:29 AM 10/09/2021  ?  8:30 PM 10/09/2021  ?  5:42 PM  ?Last 3 Weights  ?Weight (lbs) 186 lb 8.2 oz 193 lb 9 oz 190 lb  ?Weight (kg) 84.6 kg 87.8 kg 86.183 kg  ?   ?Body mass index is 32.01 kg/m?.  ?General:  Well nourished, well developed, in no acute distress ?HEENT:  normal ?Neck: mild JVD ?Vascular: No carotid bruits; Distal pulses 2+ bilaterally ?Cardiac:  normal S1, S2; RRR; no murmur  ?Lungs:  clear to auscultation bilaterally, no wheezing, rhonchi or rales  ?Abd: soft, nontender, no hepatomegaly  ?Ext: no edema ?Musculoskeletal:  No deformities, BUE and BLE strength normal and equal ?Skin: warm and dry  ?Neuro:  CNs 2-12 intact, no focal abnormalities noted ?Psych:  Normal affect  ? ?EKG:  The EKG was personally reviewed and demonstrates:  sinus tachycardia, 137 bpm, IVCD, nonspecific T wave changes ?Telemetry:  Telemetry was personally reviewed and demonstrates:  SR-ST, 90-110s ? ?Relevant CV Studies: ? ?  Echo: 10/10/21 ? ?IMPRESSIONS  ? ? ? 1. Left ventricular ejection fraction, by estimation, is 20 to 25%. The  ?left ventricle has severely decreased function. The left ventricle  ?demonstrates global hypokinesis. The left ventricular internal cavity size  ?was severely dilated. Left ventricular  ?diastolic parameters are consistent with Grade II diastolic dysfunction  ?(pseudonormalization). Elevated left ventricular end-diastolic pressure.  ? 2. Right ventricular systolic function is normal. The right ventricular  ?size is normal. There is normal pulmonary artery systolic pressure.  ? 3. Left atrial size was moderately dilated.  ? 4. Moderate pleural effusion in the left lateral region.  ? 5. The mitral valve is normal in structure. Mild mitral valve  ?regurgitation. No evidence of mitral stenosis.  ? 6. The aortic valve is tricuspid. Aortic valve regurgitation is not  ?visualized. No aortic stenosis is present.  ? 7. The inferior vena cava is normal in size with greater than 50%  ?respiratory variability, suggesting right atrial pressure of 3 mmHg.  ? ?FINDINGS  ? Left Ventricle: Left ventricular ejection fraction, by estimation, is 20  ?to 25%. The left ventricle has severely decreased function. The left  ?ventricle demonstrates global hypokinesis. The left ventricular  internal  ?cavity size was severely dilated.  ?There is no left ventricular hypertrophy. Left ventricular diastolic  ?parameters are consistent with Grade II diastolic dysfunction  ?(pseudonormalization). Elevated

## 2021-10-10 NOTE — Hospital Course (Addendum)
Lori Mcbride was admitted to the hospital with the working diagnosis of decompensated heart failure.  ? ?56 yo female with the past medical history of GERD and GI bleed who presented with dyspnea. She reported worsening symptoms for 2 weeks, as outpatient she was treated for allergies and pneumonia with no improvement in her symptoms. Positive orthopnea, PND and cough. On her initial physical examination her blood pressure was 104/84, HR 140, RR 15, and 02 saturation 93%, decreased breath sounds and rales at bases, heart with S1 and S2 present and rhythmic, abdomen soft and positive lower extremity edema +.  ? ?Na 140, K 4,0, CL 111, bicarbonate 21, glucose 115 bun 16 cr 1,0  ?BNP 437  ?High sensitive troponin 14 and 15  ?Wbc 9,0, hgb 13,3 hct 40 and plt 296  ?Sars covid 19 negative.  ? ?Chest radiograph with cardiomegaly with bilateral hilar vascular congestion. ?CT chest with faint bilateral ground glass opacities, with small bilateral pleural effusions. No pulmonary embolism.  ? ?EKG 137 bpm, right axis deviation, left bundle branch block, sinus rhythm with Q1 V1, ST depression and T wave inversion in lead II, III, AvF. Positive LVH.  ? ?Patient was placed on diuretic therapy with good response ? ?Echocardiogram with reduced LV systolic function, cardiac catheterization with non ischemic cardiomyopathy with wedge pressure 7. ? ?Patient placed on metoprolol succinate, SGLT1i, and spironolactone. ?No entresto due to risk of hypotension.  ? ?Her discharge was delayed due to persistent tachycardia, metoprolol was increased to 50 mg with good toleration.  ?Now started on ivabradine for better rate control.  ? ? ? ? ?

## 2021-10-10 NOTE — Progress Notes (Signed)
Echocardiogram ?2D Echocardiogram has been performed. ? ?Lori Mcbride ?10/10/2021, 9:38 AM ?

## 2021-10-10 NOTE — Plan of Care (Signed)
?  Problem: Education: ?Goal: Knowledge of General Education information will improve ?Description: Including pain rating scale, medication(s)/side effects and non-pharmacologic comfort measures ?10/10/2021 0531 by Presley Raddle, RN ?Outcome: Progressing ?10/09/2021 2315 by Presley Raddle, RN ?Outcome: Progressing ?  ?Problem: Health Behavior/Discharge Planning: ?Goal: Ability to manage health-related needs will improve ?10/10/2021 0531 by Presley Raddle, RN ?Outcome: Progressing ?10/09/2021 2315 by Presley Raddle, RN ?Outcome: Progressing ?  ?Problem: Clinical Measurements: ?Goal: Ability to maintain clinical measurements within normal limits will improve ?10/10/2021 0531 by Presley Raddle, RN ?Outcome: Progressing ?10/09/2021 2315 by Presley Raddle, RN ?Outcome: Progressing ?Goal: Will remain free from infection ?10/10/2021 0531 by Presley Raddle, RN ?Outcome: Progressing ?10/09/2021 2315 by Presley Raddle, RN ?Outcome: Progressing ?Goal: Diagnostic test results will improve ?10/10/2021 0531 by Presley Raddle, RN ?Outcome: Progressing ?10/09/2021 2315 by Presley Raddle, RN ?Outcome: Progressing ?Goal: Respiratory complications will improve ?10/10/2021 0531 by Presley Raddle, RN ?Outcome: Progressing ?10/09/2021 2315 by Presley Raddle, RN ?Outcome: Progressing ?Goal: Cardiovascular complication will be avoided ?10/10/2021 0531 by Presley Raddle, RN ?Outcome: Progressing ?10/09/2021 2315 by Presley Raddle, RN ?Outcome: Progressing ?  ?Problem: Activity: ?Goal: Risk for activity intolerance will decrease ?10/10/2021 0531 by Presley Raddle, RN ?Outcome: Progressing ?10/09/2021 2315 by Presley Raddle, RN ?Outcome: Progressing ?  ?Problem: Nutrition: ?Goal: Adequate nutrition will be maintained ?10/10/2021 0531 by Presley Raddle, RN ?Outcome: Progressing ?10/09/2021 2315 by Presley Raddle, RN ?Outcome: Progressing ?  ?Problem: Coping: ?Goal: Level  of anxiety will decrease ?10/10/2021 0531 by Presley Raddle, RN ?Outcome: Progressing ?10/09/2021 2315 by Presley Raddle, RN ?Outcome: Progressing ?  ?Problem: Elimination: ?Goal: Will not experience complications related to bowel motility ?10/10/2021 0531 by Presley Raddle, RN ?Outcome: Progressing ?10/09/2021 2315 by Presley Raddle, RN ?Outcome: Progressing ?Goal: Will not experience complications related to urinary retention ?10/10/2021 0531 by Presley Raddle, RN ?Outcome: Progressing ?10/09/2021 2315 by Presley Raddle, RN ?Outcome: Progressing ?  ?Problem: Pain Managment: ?Goal: General experience of comfort will improve ?10/10/2021 0531 by Presley Raddle, RN ?Outcome: Progressing ?10/09/2021 2315 by Presley Raddle, RN ?Outcome: Progressing ?  ?Problem: Safety: ?Goal: Ability to remain free from injury will improve ?10/10/2021 0531 by Presley Raddle, RN ?Outcome: Progressing ?10/09/2021 2315 by Presley Raddle, RN ?Outcome: Progressing ?  ?Problem: Skin Integrity: ?Goal: Risk for impaired skin integrity will decrease ?10/10/2021 0531 by Presley Raddle, RN ?Outcome: Progressing ?10/09/2021 2315 by Presley Raddle, RN ?Outcome: Progressing ?  ?

## 2021-10-10 NOTE — Progress Notes (Signed)
?  Progress Note ? ? ?Patient: Lori Mcbride [id number] DOB: [birth year] DOA: 10/09/2021     1 ?DOS: the patient was seen and examined on 10/10/2021 ?  ?Brief hospital course: ?Mrs. Crehan was admitted to the hospital with the working diagnosis of decompensated heart failure.  ? ?56 yo female with the past medical history of GERD and GI bleed who presented with dyspnea. She reported worsening symptoms for 2 weeks, as outpatient she was treated for allergies and pneumonia with no improvement in her symptoms. Positive orthopnea, PND and cough. On her initial physical examination her blood pressure was 104/84, HR 140, RR 15, and 02 saturation 93%, decreased breath sounds and rales at bases, heart with S1 and S2 present and rhythmic, abdomen soft and positive lower extremity edema +.  ? ?Na 140, K 4,0, CL 111, bicarbonate 21, glucose 115 bun 16 cr 1,0  ?BNP 437  ?High sensitive troponin 14 and 15  ?Wbc 9,0, hgb 13,3 hct 40 and plt 296  ?Sars covid 19 negative.  ? ?Chest radiograph with cardiomegaly with bilateral hilar vascular congestion. ?CT chest with faint bilateral ground glass opacities, with small bilateral pleural effusions. No pulmonary embolism.  ? ?EKG 137 bpm, right axis deviation, left bundle branch block, sinus rhythm with Q1 V1, ST depression and T wave inversion in lead II, III, AvF. Positive LVH.  ? ? ? ?Assessment and Plan: ?* Acute CHF (congestive heart failure) ([place]) ?Clinically heart failure decompensation, with acute cardiogenic pulmonary edema.  ? ?Urine output documented at 2,800 cc  ?Blood pressure systolic 123456 to 95 mmHg.  ? ?Plan to continue diuresis with IV furosemide. ?Follow up on echocardiogram.  ?Hold on RAS inhibition and B blockade for now to prevent worsening hypotension.  ? ? ? ?Peptic ulcer disease ?Continue with pantoprazole. ?No clinical signs of recurrent bleeding.  ? ?Seasonal allergies ?Continue with nasal steroids for now.  ? ? ? ? ?  ? ?Subjective: patient is feeling better, dyspnea  has improved, along with orthopnea.  ? ?Physical Exam: ?Vitals:  ? 10/09/21 2341 10/10/21 0300 10/10/21 0529 10/10/21 0734  ?BP: 104/84  102/76 95/72  ?Pulse: (!) 103 87 98 98  ?Resp: 20 15 20 18   ?Temp: 98.3 ?F (36.8 ?C)  98.7 ?F (37.1 ?C) 98.3 ?F (36.8 ?C)  ?TempSrc: Oral  Oral Oral  ?SpO2: 96% 93% 96% 95%  ?Weight:   84.6 kg   ?Height:      ? ?Neurology awake and alert ?ENT with no pallor ?Cardiovascular with S1 and S2 present and rhythmic, no gallops or murmurs, no rubs ?No JVD ?Trace non pitting lower extremity edema ?Respiratory with mild rales at bases.  ?Abdomen soft  ?Data Reviewed: ? ? ? ?Family Communication: I spoke with patient's daughter at the bedside, we talked in detail about patient's condition, plan of care and prognosis and all questions were addressed. ? ? ?Disposition: ?Status is: Inpatient ?Remains inpatient appropriate because: heart failure work up  ? Planned Discharge Destination: Home ? ? ? ?Author: ?Tawni Millers, MD ?10/10/2021 9:04 AM ? ?For on call review [email or web address].  ?

## 2021-10-10 NOTE — TOC Progression Note (Signed)
Transition of Care (TOC) - Progression Note  ? ? ?Patient Details  ?Name: Lori Mcbride ?MRN: [id number] ?Date of Birth: [birth year] ? ?Transition of Care (TOC) CM/SW Contact  ?Sentoria Brent Wynn Banker, LCSWA ?Phone Number: ?10/10/2021, 9:59 AM ? ?Clinical Narrative:    ? ?Transition of Care (TOC) Screening Note ? ? ?Patient Details  ?Name: Lori Mcbride ?Date of Birth: 03/03/1966 ? ? ?Transition of Care (TOC) CM/SW Contact:    ?Ivette Loyal, LCSWA ?Phone Number: ?10/10/2021, 10:00 AM ? ? ? ?Transition of Care Department [facility]) has reviewed patient and no TOC needs have been identified at this time. We will continue to monitor patient advancement through interdisciplinary progression rounds. If new patient transition needs arise, please place a TOC consult. ?  ? ? ?  ?  ? ?Expected Discharge Plan and Services ?  ?  ?  ?  ?  ?                ?  ?  ?  ?  ?  ?  ?  ?  ?  ?  ? ? ?Social Determinants of Health (SDOH) Interventions ?  ? ?Readmission Risk Interventions ?   ? View : No data to display.  ?  ?  ?  ? ? ?

## 2021-10-10 NOTE — TOC Initial Note (Signed)
Transition of Care (TOC) - Initial/Assessment Note  ? ? ?Patient Details  ?Name: Lori Mcbride ?MRN: [id number] ?Date of Birth: 07-Dec-1965 ? ?Transition of Care (TOC) CM/SW Contact:    ?Beckie Busing, RN ?Phone Number:[phone] ? ?10/10/2021, 2:11 PM ? ?Clinical Narrative:                 ?TOC consulted for heart failure home health screen. Patient and spouse at bedside. CM introduces self and explained reason for consult. Patient states that she has never been told that she has heart failure nor has she has any issues with her heart. Per husband this is the first incident. Patient came to ED because she was having shortness of breath. Patient states that she does not have a cardiologist because she has never had any issues. Patient lives at home with spouse where she functions independently. No DME or Home health needs. TOC will continue to follow for any disposition needs.  ? ?  ?  ? ? ?Patient Goals and CMS Choice ?  ?  ?  ? ?Expected Discharge Plan and Services ?  ?  ?  ?  ?  ?                ?  ?  ?  ?  ?  ?  ?  ?  ?  ?  ? ?Prior Living Arrangements/Services ?  ?  ?  ?       ?  ?  ?  ?  ? ?Activities of Daily Living ?  ?  ? ?Permission Sought/Granted ?  ?  ?   ?   ?   ?   ? ?Emotional Assessment ?  ?  ?  ?  ?  ?  ? ?Admission diagnosis:  Acute pulmonary edema (HCC) [J81.0] ?Pleural effusion [J90] ?Acute CHF (congestive heart failure) (HCC) [I50.9] ?Heart failure, unspecified HF chronicity, unspecified heart failure type (HCC) [I50.9] ?Patient Active Problem List  ? Diagnosis Date Noted  ? Acute CHF (congestive heart failure) (HCC) 10/09/2021  ? Peptic ulcer disease 10/09/2021  ? Seasonal allergies 10/09/2021  ? GI bleed 01/12/2020  ? Acute upper GI bleed 01/11/2020  ? Hyperglycemia 01/11/2020  ? ?PCP:  Pcp, No ?Pharmacy:   ?[id number] - [place], [place] - [address] ?[address] ?[facility] [place] [address] ?Phone: [phone] Fax: [phone] ? ? ? ? ?Social Determinants of Health (SDOH)  Interventions ?  ? ?Readmission Risk Interventions ?   ? View : No data to display.  ?  ?  ?  ? ? ? ?

## 2021-10-10 NOTE — Consult Note (Addendum)
?Cardiology Consultation:  ? ?Patient ID: Lori Mcbride ?MRN: [id number]; DOB: 1966-06-28 ? ?Admit date: 10/09/2021 ?Date of Consult: 10/10/2021 ? ?PCP:  Pcp, No ?  ?[facility] Providers ?Cardiologist:  None   New  ? ?Patient Profile:  ? ?Lori Mcbride is a 56 y.o. female with a hx of GERD, GI bleed who is being seen 10/10/2021 for the evaluation of CHF at the request of Dr. Cathlean Sauer. ? ?History of Present Illness:  ? ?Lori Mcbride is a 56 yo female with PMH noted above. She has never been evaluated by cardiology. She does not take any medications on a regular basis.  ? ?States she was initially seen at UC 2 weeks prior with shortness of breath and thought to have asthma/allergies. She was given an inhaler, flonase and tessalon pearls. Continued to have ongoing dyspnea and non productive cough and presented back. Then thought to have PNA and placed on doxycycline but didn't have improvement in her symptoms.  ? ?Presented back to UC yesterday with ongoing symptoms and sent to the ED for further evaluation.  ? ?In the ED her labs showed sodium 140, potassium 4, creatinine 1.03, BNP 437, high-sensitivity troponin 14>> 15, WBC 6.9, hemoglobin 13.3, TSH 1.03.  EKG showed sinus tachycardia, 137 bpm, IVCD, nonspecific T wave changes.  Chest x-ray showed cardiomegaly with pulmonary edema with concern for CHF with inability to exclude pneumonia.  Small bilateral pleural effusions.  She was admitted to internal medicine for further management.  Started on IV Lasix.  CT angio negative for PE with bilateral pleural effusions and pulmonary edema.  Echocardiogram 3/22 showed LVEF of 20 to 25% with global hypokinesis, grade 2 diastolic dysfunction, normal RV, moderately dilated left atrial size, trivial pericardial effusion.  Cardiology asked to evaluate.  ? ?In talking with patient she's had increase LE edema, orthopnea and PND. Has had to sleep on 3 pillows at night. No chest pain. She denies any family hx of CAD, CHF or arrhythmias.     ? ?Past Medical History:  ?Diagnosis Date  ? GI bleeding 12/2019  ? Knee pain, right   ? Refusal of blood product   ? Refusal of blood transfusions as patient is Jehovah's Witness   ? ? ?Past Surgical History:  ?Procedure Laterality Date  ? APPENDECTOMY    ? ESOPHAGOGASTRODUODENOSCOPY N/A 01/11/2020  ? Procedure: ESOPHAGOGASTRODUODENOSCOPY (EGD);  Surgeon: Wonda Horner, MD;  Location: [facility] ENDOSCOPY;  Service: Endoscopy;  Laterality: N/A;  ? HEMOSTASIS CLIP PLACEMENT  01/11/2020  ? Procedure: HEMOSTASIS CLIP PLACEMENT;  Surgeon: Wonda Horner, MD;  Location: [facility] ENDOSCOPY;  Service: Endoscopy;;  ? HEMOSTASIS CONTROL  01/11/2020  ? Procedure: HEMOSTASIS CONTROL;  Surgeon: Wonda Horner, MD;  Location: [facility] ENDOSCOPY;  Service: Endoscopy;;  epinephrine injection  ?  ? ?Home Medications:  ?Prior to Admission medications   ?Medication Sig Start Date End Date Taking? Authorizing Provider  ?benzonatate (TESSALON) 100 MG capsule Take by mouth 3 (three) times daily as needed for cough.   Yes [provider]  ?fluticasone (VERAMYST) 27.5 MCG/SPRAY nasal spray Place 2 sprays into the nose daily.   Yes [provider]  ?promethazine-dextromethorphan (PROMETHAZINE-DM) 6.25-15 MG/5ML syrup Take 5 mLs by mouth 4 (four) times daily as needed for cough.   Yes [provider]  ?ferrous sulfate 325 (65 FE) MG tablet Take 1 tablet (325 mg total) by mouth daily. ?Patient not taking: Reported on 10/09/2021 01/29/21   Deno Etienne, DO  ?pantoprazole (PROTONIX) 20 MG tablet Take 1  tablet (20 mg total) by mouth daily. 01/29/21 02/28/21  Deno Etienne, DO  ? ? ?Inpatient Medications: ?Scheduled Meds: ? enoxaparin (LOVENOX) injection  40 mg Subcutaneous Q24H  ? fluticasone  2 spray Each Nare Daily  ? furosemide  40 mg Intravenous Q12H  ? pantoprazole  20 mg Oral Daily  ? sodium chloride flush  3 mL Intravenous Q12H  ? ?Continuous Infusions: ? sodium chloride    ? ?PRN Meds: ?sodium chloride, acetaminophen, ALPRAZolam,  magnesium hydroxide, ondansetron (ZOFRAN) IV, sodium chloride flush, traZODone ? ?Allergies:   No Known Allergies ? ?Social History:   ?Social History  ? ?Socioeconomic History  ? Marital status: Married  ?  Spouse name: Not on file  ? Number of children: Not on file  ? Years of education: Not on file  ? Highest education level: Not on file  ?Occupational History  ? Occupation: housekeeper  ?Tobacco Use  ? Smoking status: Never  ? Smokeless tobacco: Never  ?Vaping Use  ? Vaping Use: Never used  ?Substance and Sexual Activity  ? Alcohol use: Never  ? Drug use: Never  ? Sexual activity: Not on file  ?Other Topics Concern  ? Not on file  ?Social History Narrative  ? Not on file  ? ?Social Determinants of Health  ? ?Financial Resource Strain: Not on file  ?Food Insecurity: Not on file  ?Transportation Needs: Not on file  ?Physical Activity: Not on file  ?Stress: Not on file  ?Social Connections: Not on file  ?Intimate Partner Violence: Not on file  ?  ?Family History:   ? ?Family History  ?Problem Relation Age of Onset  ? Hypertension Father   ?  ? ?ROS:  ?Please see the history of present illness.  ? ?All other ROS reviewed and negative.    ? ?Physical Exam/Data:  ? ?Vitals:  ? 10/10/21 0529 10/10/21 0734 10/10/21 1109 10/10/21 1607  ?BP: 102/76 95/72 97/74  100/70  ?Pulse: 98 98 98 98  ?Resp: 20 18 20 20   ?Temp: 98.7 ?F (37.1 ?C) 98.3 ?F (36.8 ?C) 98.4 ?F (36.9 ?C) 98.3 ?F (36.8 ?C)  ?TempSrc: Oral Oral Oral Oral  ?SpO2: 96% 95% 92% 95%  ?Weight: 84.6 kg     ?Height:      ? ? ?Intake/Output Summary (Last 24 hours) at 10/10/2021 1711 ?Last data filed at 10/10/2021 1053 ?Gross per 24 hour  ?Intake 660 ml  ?Output 4450 ml  ?Net -3790 ml  ? ? ?  10/10/2021  ?  5:29 AM 10/09/2021  ?  8:30 PM 10/09/2021  ?  5:42 PM  ?Last 3 Weights  ?Weight (lbs) 186 lb 8.2 oz 193 lb 9 oz 190 lb  ?Weight (kg) 84.6 kg 87.8 kg 86.183 kg  ?   ?Body mass index is 32.01 kg/m?.  ?General:  Well nourished, well developed, in no acute distress ?HEENT:  normal ?Neck: mild JVD ?Vascular: No carotid bruits; Distal pulses 2+ bilaterally ?Cardiac:  normal S1, S2; RRR; no murmur  ?Lungs:  clear to auscultation bilaterally, no wheezing, rhonchi or rales  ?Abd: soft, nontender, no hepatomegaly  ?Ext: no edema ?Musculoskeletal:  No deformities, BUE and BLE strength normal and equal ?Skin: warm and dry  ?Neuro:  CNs 2-12 intact, no focal abnormalities noted ?Psych:  Normal affect  ? ?EKG:  The EKG was personally reviewed and demonstrates:  sinus tachycardia, 137 bpm, IVCD, nonspecific T wave changes ?Telemetry:  Telemetry was personally reviewed and demonstrates:  SR-ST, 90-110s ? ?Relevant CV Studies: ? ?  Echo: 10/10/21 ? ?IMPRESSIONS  ? ? ? 1. Left ventricular ejection fraction, by estimation, is 20 to 25%. The  ?left ventricle has severely decreased function. The left ventricle  ?demonstrates global hypokinesis. The left ventricular internal cavity size  ?was severely dilated. Left ventricular  ?diastolic parameters are consistent with Grade II diastolic dysfunction  ?(pseudonormalization). Elevated left ventricular end-diastolic pressure.  ? 2. Right ventricular systolic function is normal. The right ventricular  ?size is normal. There is normal pulmonary artery systolic pressure.  ? 3. Left atrial size was moderately dilated.  ? 4. Moderate pleural effusion in the left lateral region.  ? 5. The mitral valve is normal in structure. Mild mitral valve  ?regurgitation. No evidence of mitral stenosis.  ? 6. The aortic valve is tricuspid. Aortic valve regurgitation is not  ?visualized. No aortic stenosis is present.  ? 7. The inferior vena cava is normal in size with greater than 50%  ?respiratory variability, suggesting right atrial pressure of 3 mmHg.  ? ?FINDINGS  ? Left Ventricle: Left ventricular ejection fraction, by estimation, is 20  ?to 25%. The left ventricle has severely decreased function. The left  ?ventricle demonstrates global hypokinesis. The left ventricular  internal  ?cavity size was severely dilated.  ?There is no left ventricular hypertrophy. Left ventricular diastolic  ?parameters are consistent with Grade II diastolic dysfunction  ?(pseudonormalization). Elevated

## 2021-10-11 ENCOUNTER — Encounter (HOSPITAL_COMMUNITY)
Admission: EM | Disposition: A | Discharge status: Home / Self Care | Payer: Self-pay | Source: Ambulatory Visit | Attending: Internal Medicine

## 2021-10-11 ENCOUNTER — Other Ambulatory Visit (HOSPITAL_COMMUNITY): Payer: Self-pay

## 2021-10-11 ENCOUNTER — Encounter (HOSPITAL_COMMUNITY): Payer: Self-pay | Admitting: Family Medicine

## 2021-10-11 DIAGNOSIS — I5021 Acute systolic (congestive) heart failure: Secondary | ICD-10-CM

## 2021-10-11 DIAGNOSIS — K279 Peptic ulcer, site unspecified, unspecified as acute or chronic, without hemorrhage or perforation: Secondary | ICD-10-CM | POA: Diagnosis not present

## 2021-10-11 DIAGNOSIS — J302 Other seasonal allergic rhinitis: Secondary | ICD-10-CM | POA: Diagnosis not present

## 2021-10-11 HISTORY — PX: RIGHT/LEFT HEART CATH AND CORONARY ANGIOGRAPHY: CATH118266

## 2021-10-11 LAB — LIPID PANEL
Cholesterol: 137 mg/dL (ref 0–200)
HDL: 41 mg/dL (ref >40)
LDL Cholesterol: 83 mg/dL (ref 0–99)
Total CHOL/HDL Ratio: 3.3 RATIO
Triglycerides: 64 mg/dL (ref <150)
VLDL: 13 mg/dL (ref 0–40)

## 2021-10-11 LAB — BASIC METABOLIC PANEL
Anion gap: 12 (ref 5–15)
BUN: 13 mg/dL (ref 6–20)
CO2: 24 mmol/L (ref 22–32)
Calcium: 9.1 mg/dL (ref 8.9–10.3)
Chloride: 103 mmol/L (ref 98–111)
Creatinine, Ser: 0.85 mg/dL (ref 0.44–1.00)
GFR, Estimated: >60 mL/min (ref >60)
Glucose, Bld: 107 mg/dL — ABNORMAL HIGH (ref 70–99)
Potassium: 3.5 mmol/L (ref 3.5–5.1)
Sodium: 139 mmol/L (ref 135–145)

## 2021-10-11 LAB — HEMOGLOBIN A1C
Hgb A1c MFr Bld: 5.6 % (ref 4.8–5.6)
Mean Plasma Glucose: 114.02 mg/dL

## 2021-10-11 SURGERY — RIGHT/LEFT HEART CATH AND CORONARY ANGIOGRAPHY
Anesthesia: LOCAL

## 2021-10-11 MED ORDER — FUROSEMIDE 20 MG PO TABS
20.0000 mg | ORAL_TABLET | Freq: Every day | ORAL | 0 refills | Status: DC
  Filled 2021-10-11: qty 30, 30d supply, fill #0

## 2021-10-11 MED ORDER — ASPIRIN 81 MG PO CHEW
81.0000 mg | CHEWABLE_TABLET | ORAL | Status: AC
  Administered 2021-10-11: 81 mg via ORAL
  Filled 2021-10-11: qty 1

## 2021-10-11 MED ORDER — MIDAZOLAM HCL 2 MG/2ML IJ SOLN
INTRAMUSCULAR | Status: AC
Start: 2021-10-11 — End: ?
  Filled 2021-10-11: qty 2

## 2021-10-11 MED ORDER — DAPAGLIFLOZIN PROPANEDIOL 10 MG PO TABS
10.0000 mg | ORAL_TABLET | Freq: Every day | ORAL | 0 refills | Status: DC
  Filled 2021-10-11: qty 30, 30d supply, fill #0

## 2021-10-11 MED ORDER — SODIUM CHLORIDE 0.9 % IV SOLN: INTRAVENOUS | Status: DC

## 2021-10-11 MED ORDER — POTASSIUM CHLORIDE CRYS ER 10 MEQ PO TBCR
10.0000 meq | EXTENDED_RELEASE_TABLET | Freq: Every day | ORAL | Status: DC
  Administered 2021-10-11 – 2021-10-14 (×4): 10 meq via ORAL
  Filled 2021-10-11 (×4): qty 1

## 2021-10-11 MED ORDER — LIDOCAINE HCL (PF) 1 % IJ SOLN
INTRAMUSCULAR | Status: DC | PRN
  Administered 2021-10-11: 10 mL

## 2021-10-11 MED ORDER — HEPARIN (PORCINE) IN NACL 1000-0.9 UT/500ML-% IV SOLN
INTRAVENOUS | Status: AC
  Filled 2021-10-11: qty 500

## 2021-10-11 MED ORDER — VERAPAMIL HCL 2.5 MG/ML IV SOLN
INTRAVENOUS | Status: DC | PRN
  Administered 2021-10-11: 10 mL via INTRA_ARTERIAL

## 2021-10-11 MED ORDER — IOHEXOL 350 MG/ML SOLN
INTRAVENOUS | Status: DC | PRN
  Administered 2021-10-11: 30 mL

## 2021-10-11 MED ORDER — HEPARIN (PORCINE) IN NACL 1000-0.9 UT/500ML-% IV SOLN
INTRAVENOUS | Status: DC | PRN
  Administered 2021-10-11 (×2): 500 mL

## 2021-10-11 MED ORDER — HYDRALAZINE HCL 20 MG/ML IJ SOLN: 10.0000 mg | INTRAMUSCULAR | Status: AC | PRN

## 2021-10-11 MED ORDER — ACETAMINOPHEN 325 MG PO TABS: 650.0000 mg | ORAL_TABLET | ORAL | Status: DC | PRN

## 2021-10-11 MED ORDER — HEPARIN SODIUM (PORCINE) 1000 UNIT/ML IJ SOLN
INTRAMUSCULAR | Status: DC | PRN
  Administered 2021-10-11: 5000 [IU] via INTRAVENOUS

## 2021-10-11 MED ORDER — METOPROLOL SUCCINATE ER 25 MG PO TB24
25.0000 mg | ORAL_TABLET | Freq: Every day | ORAL | 0 refills | Status: DC
  Filled 2021-10-11: qty 30, 30d supply, fill #0

## 2021-10-11 MED ORDER — SODIUM CHLORIDE 0.9% FLUSH: 3.0000 mL | INTRAVENOUS | Status: DC | PRN

## 2021-10-11 MED ORDER — MIDAZOLAM HCL 2 MG/2ML IJ SOLN
INTRAMUSCULAR | Status: DC | PRN
  Administered 2021-10-11: 1 mg via INTRAVENOUS

## 2021-10-11 MED ORDER — HEPARIN SODIUM (PORCINE) 1000 UNIT/ML IJ SOLN
INTRAMUSCULAR | Status: AC
  Filled 2021-10-11: qty 10

## 2021-10-11 MED ORDER — SODIUM CHLORIDE 0.9 % IV SOLN: 250.0000 mL | INTRAVENOUS | Status: DC | PRN

## 2021-10-11 MED ORDER — ONDANSETRON HCL 4 MG/2ML IJ SOLN: 4.0000 mg | Freq: Four times a day (QID) | INTRAMUSCULAR | Status: DC | PRN

## 2021-10-11 MED ORDER — POTASSIUM CHLORIDE CRYS ER 10 MEQ PO TBCR
10.0000 meq | EXTENDED_RELEASE_TABLET | Freq: Every day | ORAL | 0 refills | Status: DC
Start: 2021-10-11 — End: 2021-10-22
  Filled 2021-10-11: qty 30, 30d supply, fill #0

## 2021-10-11 MED ORDER — SODIUM CHLORIDE 0.9% FLUSH
3.0000 mL | Freq: Two times a day (BID) | INTRAVENOUS | Status: DC
  Administered 2021-10-12 (×3): 3 mL via INTRAVENOUS

## 2021-10-11 MED ORDER — DAPAGLIFLOZIN PROPANEDIOL 10 MG PO TABS
10.0000 mg | ORAL_TABLET | Freq: Every day | ORAL | Status: DC
Start: 2021-10-11 — End: 2021-10-14
  Administered 2021-10-11 – 2021-10-14 (×4): 10 mg via ORAL
  Filled 2021-10-11 (×4): qty 1

## 2021-10-11 MED ORDER — FUROSEMIDE 20 MG PO TABS
20.0000 mg | ORAL_TABLET | Freq: Every day | ORAL | Status: DC
Start: 2021-10-12 — End: 2021-10-14
  Administered 2021-10-12 – 2021-10-14 (×3): 20 mg via ORAL
  Filled 2021-10-11 (×3): qty 1

## 2021-10-11 MED ORDER — METOPROLOL SUCCINATE ER 25 MG PO TB24
25.0000 mg | ORAL_TABLET | Freq: Every day | ORAL | Status: DC
Start: 2021-10-11 — End: 2021-10-12
  Administered 2021-10-11: 25 mg via ORAL
  Filled 2021-10-11: qty 1

## 2021-10-11 MED ORDER — VERAPAMIL HCL 2.5 MG/ML IV SOLN
INTRAVENOUS | Status: AC
  Filled 2021-10-11: qty 2

## 2021-10-11 MED ORDER — SPIRONOLACTONE 25 MG PO TABS
12.5000 mg | ORAL_TABLET | Freq: Every day | ORAL | 0 refills | Status: DC
  Filled 2021-10-11: qty 15, 30d supply, fill #0

## 2021-10-11 MED ORDER — LIDOCAINE HCL (PF) 1 % IJ SOLN
INTRAMUSCULAR | Status: AC
  Filled 2021-10-11: qty 30

## 2021-10-11 MED ORDER — SPIRONOLACTONE 12.5 MG HALF TABLET
12.5000 mg | ORAL_TABLET | Freq: Every day | ORAL | Status: DC
Start: 2021-10-11 — End: 2021-10-14
  Administered 2021-10-11 – 2021-10-14 (×4): 12.5 mg via ORAL
  Filled 2021-10-11 (×4): qty 1

## 2021-10-11 MED ORDER — LABETALOL HCL 5 MG/ML IV SOLN: 10.0000 mg | INTRAVENOUS | Status: AC | PRN

## 2021-10-11 SURGICAL SUPPLY — 15 items
BAND CMPR LRG ZPHR (HEMOSTASIS) ×1
BAND ZEPHYR COMPRESS 30 LONG (HEMOSTASIS) ×1 IMPLANT
CATH BALLN WEDGE 5F 110CM (CATHETERS) ×1 IMPLANT
CATH DIAG 6FR JR4 (CATHETERS) ×1 IMPLANT
CATH DIAG 6FR PIGTAIL ANGLED (CATHETERS) ×1 IMPLANT
CATH INFINITI 6F FL3.5 (CATHETERS) ×1 IMPLANT
GLIDESHEATH SLEND SS 6F .021 (SHEATH) ×1 IMPLANT
GUIDEWIRE INQWIRE 1.5J.035X260 (WIRE) IMPLANT
INQWIRE 1.5J .035X260CM (WIRE) ×2
KIT HEART LEFT (KITS) ×2 IMPLANT
PACK CARDIAC CATHETERIZATION (CUSTOM PROCEDURE TRAY) ×2 IMPLANT
SHEATH GLIDE SLENDER 4/5FR (SHEATH) ×1 IMPLANT
SHEATH PROBE COVER 6X72 (BAG) ×1 IMPLANT
TRANSDUCER W/STOPCOCK (MISCELLANEOUS) ×2 IMPLANT
TUBING CIL FLEX 10 FLL-RA (TUBING) ×2 IMPLANT

## 2021-10-11 NOTE — TOC CM/SW Note (Addendum)
HF TOC CM provided pt with a Farxiga copay card $0. Pt meds will come up from [facility] pharmacy and if dc on weekend, will be stored in pharmacy until dc. Isidoro Donning RN3 CCM, Heart Failure TOC CM [phone]  ?

## 2021-10-11 NOTE — Plan of Care (Signed)
?  Problem: Education: ?Goal: Knowledge of General Education information will improve ?Description: Including pain rating scale, medication(s)/side effects and non-pharmacologic comfort measures ?Outcome: Progressing ?  ?Problem: Health Behavior/Discharge Planning: ?Goal: Ability to manage health-related needs will improve ?Outcome: Progressing ?  ?Problem: Clinical Measurements: ?Goal: Respiratory complications will improve ?Outcome: Progressing ?  ?Problem: Activity: ?Goal: Risk for activity intolerance will decrease ?Outcome: Progressing ?  ?Problem: Skin Integrity: ?Goal: Risk for impaired skin integrity will decrease ?Outcome: Progressing ?  ?

## 2021-10-11 NOTE — Progress Notes (Signed)
Heart Failure Nurse Navigator Progress Note ? ?PCP: No primary care provider on file. ?PCP-Cardiologist:  ?Admission Diagnosis: Shortness of Breath ?Admitted from: Home ? ?Presentation:   ?Lori Mcbride presented with worsening shortness of breath over the last few weeks. She stated she noticed her feet swelling and starting to hurt, then she needed to add a few pillows to help her sleep, couldn't lay flat. She thought she had pneumonia a week ago and was on antibiotics, however her symptoms continued. Had a heart cath on 10/11/21 and will be med managed.  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 She cooks at home and usually doesn't use a lot of salt, because her husband also has cardiac issues. She states she drinks about a half gallon of fluid a day and rarely drinks a soda.  ?We spoke about weighing daily and when to call her MD vs coming to the ER. Education was done on fluid and diet , patient , husband and soon voiced their understanding of these restrictions and the importance of them. Patient was very willing to do what needs to be done to keep her out of the hospital and healthy.  ? ?ECHO/ LVEF: 20% ? ?Clinical Course: ? ?Past Medical History:  ?Diagnosis Date  ? GI bleeding 12/2019  ? Knee pain, right   ? Refusal of blood product   ? Refusal of blood transfusions as patient is Jehovah's Witness   ?  ? ?Social History  ? ?Socioeconomic History  ? Marital status: Married  ?  Spouse name: [name]  ? Number of children: 3  ? Years of education: Not on file  ? Highest education level: High school graduate  ?Occupational History  ? 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  ?Tobacco Use  ? Smoking status: Never  ? Smokeless tobacco: Never  ?Vaping Use  ? Vaping Use: Never used  ?Substance and Sexual Activity  ? Alcohol use: Never  ? Drug use: Never  ? Sexual activity: Not on file  ?Other Topics Concern  ? Not on file   ?Social History Narrative  ? Not on file  ? ?Social Determinants of Health  ? ?Financial Resource Strain: Low Risk   ? Difficulty of Paying Living Expenses: Not very hard  ?Food Insecurity: Food Insecurity Present  ? Worried About Charity fundraiser in the Last Year: Sometimes true  ? Ran Out of Food in the Last Year: Never true  ?Transportation Needs: No Transportation Needs  ? Lack of Transportation (Medical): No  ? Lack of Transportation (Non-Medical): No  ?Physical Activity: Not on file  ?Stress: Not on file  ?Social Connections: Not on file  ? ?Education Assessment and Provision: ? ?Detailed education and instructions provided on heart failure disease management including the following: ? ?Signs and symptoms of Heart Failure ?When to call the physician ?Importance of daily weights ?Low sodium diet ?Fluid restriction ?Medication management ?Anticipated future follow-up appointments ? ?Patient education given on each of the above topics.  Patient acknowledges understanding via teach back method and acceptance of all instructions. ? ?Education Materials:  "Living Better With Heart Failure" Booklet, HF zone tool, & Daily Weight Tracker Tool. ? ?Patient has scale at home: yes ?Patient has pill box at home: NA   ? ?High Risk Criteria for Readmission and/or Poor Patient Outcomes: ?Heart failure hospital admissions (last 6 months): 1  ?No Show rate: 0 % ?  Difficult social situation: No ?Demonstrates medication adherence: Yes ?Primary Language: Spanish/ speaks english very well. ?Literacy level: Reading and writing, comprehension well.  ? ?Barriers of Care:   ?Med cost _ Pharm/ CSW working with Ins and co-pay cards.  ? ?Considerations/Referrals:  ? ?Referral made to Heart Failure Pharmacist Stewardship: yes- med costs ?Referral made to Heart Failure CSW/NCM TOC: Yes- co-pay cards for new medications ?Referral made to Heart & Vascular TOC clinic: yes- HF TOC 10/22/21. ? ?Items for Follow-up on DC/TOC: ?Optimize ?Med  costs ? ? ?Earnestine Leys, BSN, RN ?Heart Failure Nurse Navigator ?[phone]   ?

## 2021-10-11 NOTE — Progress Notes (Signed)
Mobility Specialist Progress Note  ? ? 10/11/21 1710  ?Mobility  ?Activity Ambulated with assistance in hallway  ?Level of Assistance Contact guard assist, steadying assist  ?Assistive Device  ?(HHA)  ?Distance Ambulated (ft) 180 ft  ?Activity Response Tolerated well  ?$Mobility charge 1 Mobility  ? ?During Mobility: 136 HR ?Post-Mobility: 125 HR ? ?Pt received in bed and agreeable. No complaints. Returned to bed with daughter present.  ? ?Lori Mcbride ?Mobility Specialist  ?  ?

## 2021-10-11 NOTE — Plan of Care (Signed)

## 2021-10-11 NOTE — Progress Notes (Addendum)
?Progress Note ? ? ?Patient: Lori Mcbride [id number] DOB: Jun 09, 1966 DOA: 10/09/2021     2 ?DOS: the patient was seen and examined on 10/11/2021 ?  ?Brief hospital course: ?Lori Mcbride was admitted to the hospital with the working diagnosis of decompensated heart failure.  ? ?56 yo female with the past medical history of GERD and GI bleed who presented with dyspnea. She reported worsening symptoms for 2 weeks, as outpatient she was treated for allergies and pneumonia with no improvement in her symptoms. Positive orthopnea, PND and cough. On her initial physical examination her blood pressure was 104/84, HR 140, RR 15, and 02 saturation 93%, decreased breath sounds and rales at bases, heart with S1 and S2 present and rhythmic, abdomen soft and positive lower extremity edema +.  ? ?Na 140, K 4,0, CL 111, bicarbonate 21, glucose 115 bun 16 cr 1,0  ?BNP 437  ?High sensitive troponin 14 and 15  ?Wbc 9,0, hgb 13,3 hct 40 and plt 296  ?Sars covid 19 negative.  ? ?Chest radiograph with cardiomegaly with bilateral hilar vascular congestion. ?CT chest with faint bilateral ground glass opacities, with small bilateral pleural effusions. No pulmonary embolism.  ? ?EKG 137 bpm, right axis deviation, left bundle branch block, sinus rhythm with Q1 V1, ST depression and T wave inversion in lead II, III, AvF. Positive LVH.  ? ?Patient was placed on diuretic therapy with good response ? ?Echocardiogram with reduced LV systolic function, cardiac catheterization with non ischemic cardiomyopathy with wedge pressure 7. ? ?Possible dc home in the next 24 hrs, close monitoring of blood pressure.  ? ? ? ?Assessment and Plan: ?* Acute CHF (congestive heart failure) (HCC) ?Acute cardiogenic pulmonary edema.  ? ?Echocardiogram with LV EF 20 to 25%, with global hypokinesis, elevated end diastolic pressure, RV with preserved systolic function, moderate left atrial dilatation. No significant valvular disease.  ? ?Cardiac catheterization with no  significant coronary artery disease, normal right dominant circulation. Cardiac output 11 L./min and index 6/L, wedge pressure 7 and LVEDP 11. ?Ejection fraction left ventricle 30%.  ? ?Urine output documented at 2,600 cc  ?Blood pressure systolic 88 to 100 mmHg.  ? ?Patient has been placed on metoprolol succinate, dapagliflozin and spironolactone.  ? ?Plan to continue blood pressure monitoring, holding on Entresto for now.  ?Patient will need diuretic therapy at discharge, will order furosemide along with Kcl supplementation from [facility] pharmacy.  ? ? ? ? ?Peptic ulcer disease ?Continue with pantoprazole. ?No clinical signs of recurrent bleeding.  ? ?Seasonal allergies ?Continue with nasal steroids for now.  ? ? ? ? ?  ? ?Subjective: patient is feeling better, dyspnea has improved.  ? ?Physical Exam: ?Vitals:  ? 10/11/21 1100 10/11/21 1204 10/11/21 1344 10/11/21 1556  ?BP: (!) 88/63  98/68 101/66  ?Pulse: 94  97 99  ?Resp: (!) 24  15   ?Temp:  97.9 ?F (36.6 ?C)    ?TempSrc:  Oral    ?SpO2: 93%  95%   ?Weight:      ?Height:      ? ?Neurology awake and alert ?ENT with no pallor ?Cardiovascular with S1 and S2 present and rhythmic with no gallops, rubs or murmurs ?No JVD ?Trace pitting bilateral lower extremity edema ?Respiratory with no wheezing or rales ? Abdomen soft and non tender  ?Data Reviewed: ? ? ? ?Family Communication: I spoke with patient's daughter at the bedside, we talked in detail about patient's condition, plan of care and prognosis and all questions were addressed. ? ? ?  Disposition: ?Status is: Inpatient ?Remains inpatient appropriate because: heart failure  ? Planned Discharge Destination: Home ? ?Author: ?Coralie Keens, MD ?10/11/2021 4:50 PM ? ?For on call review [email or web address].  ?

## 2021-10-11 NOTE — Progress Notes (Signed)
Heart Failure Stewardship Pharmacist Progress Note ? ? ?PCP: No primary care provider on file. ?PCP-Cardiologist: Candee Furbish, MD  ? ? ?HPI:  ?56 yo F with PMH of GI bleeding and GERD. She presented to the ED on 3/22 with shortness of breath, chest discomfort, orthopnea, and mild LE edema. CXR with cardiomegaly and pulmonary edema. CTA negative for PE. An ECHO was done on 3/23 and LVEF was 20-25% with G2DD. R/LHC from 3/24 with no CAD and good hemodynamics - RA 1, PA 15, wedge 7, CO 11, CI 6. ? ?Current HF Medications: ?Beta Blocker: metoprolol XL 25 mg daily ?Aldosterone Antagonist: spironolactone 12.5 mg daily ?SGLT2i: Farxiga 10 mg daily ? ?Prior to admission HF Medications: ?None ? ?Pertinent Lab Values: ?Serum creatinine 0.85, BUN 13, Potassium 3.5, Sodium 139, BNP 437.3, A1c 5.6  ? ?Vital Signs: ?Weight: 181 lbs (admission weight: 193 lbs) ?Blood pressure: 90-100/70s  ?Heart rate: 90s  ?I/O: -2.8L yesterday; net -4.5L ? ?Medication Assistance / Insurance Benefits Check: ?Does the patient have prescription insurance?  Yes ?Type of insurance plan: [employer] commercial insurance ? ?Outpatient Pharmacy:  ?[facility] ?Is the patient willing to use [place] at discharge? Yes ?Is the patient willing to transition their outpatient pharmacy to utilize a [facility] outpatient pharmacy?   Pending ?  ? ?Assessment: ?1. 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 NYHA class II symptoms. ?- No further IV lasix following cath findings ?- Agree with starting metoprolol XL 25 mg daily ?- Agree with starting spironolactone 12.5 mg daily  ?- Agree with starting Farxiga 10 mg daily   ? ?Plan: ?1) Medication changes recommended at this time: ?- Agree with changes as above ?- Consider sending discharge meds to [place] today in case she is discharged over the weekend. Meds will be stored in main pharmacy until she is discharged.  ? ?2) Patient assistance: ?- Farxiga copay $15 -  monthly copay card lowers to $0 per month. CM asked to bring her a copay card today. ?- Entresto copay $100 - monthly copay card lowers to $10 per fill ? ? ?Kerby Nora, PharmD, BCPS ?Heart Failure Stewardship Pharmacist ?Phone [phone] ? ? ?

## 2021-10-11 NOTE — Progress Notes (Addendum)
? ?Progress Note ? ?Patient Name: Lori Mcbride ?Date of Encounter: 10/11/2021 ? ?[facility] Cardiologist: Candee Furbish, MD  ? ?Subjective  ? ?Feels well post catheterization.  Shortness of breath decreased.  Her husband and son [name] and [name] Junior in the room.  Very kind. ? ?Inpatient Medications  ?  ?Scheduled Meds: ? enoxaparin (LOVENOX) injection  40 mg Subcutaneous Q24H  ? fluticasone  2 spray Each Nare Daily  ? furosemide  40 mg Intravenous Q12H  ? pantoprazole  20 mg Oral Daily  ? sodium chloride flush  3 mL Intravenous Q12H  ? sodium chloride flush  3 mL Intravenous Q12H  ? sodium chloride flush  3 mL Intravenous Q12H  ? ?Continuous Infusions: ? sodium chloride    ? sodium chloride    ? ?PRN Meds: ?sodium chloride, sodium chloride, acetaminophen, ALPRAZolam, hydrALAZINE, labetalol, magnesium hydroxide, ondansetron (ZOFRAN) IV, sodium chloride flush, sodium chloride flush, traZODone  ? ?Vital Signs  ?  ?Vitals:  ? 10/11/21 0940 10/11/21 0945 10/11/21 0946 10/11/21 0947  ?BP: 105/63 98/63    ?Pulse: 95 95  94  ?Resp: 16 (!) 22  11  ?Temp:      ?TempSrc:      ?SpO2: 94% 94% 94% 96%  ?Weight:      ?Height:      ? ? ?Intake/Output Summary (Last 24 hours) at 10/11/2021 1019 ?Last data filed at 10/11/2021 0351 ?Gross per 24 hour  ?Intake 480 ml  ?Output 1800 ml  ?Net -1320 ml  ? ? ?  10/11/2021  ?  3:49 AM 10/10/2021  ?  5:29 AM 10/09/2021  ?  8:30 PM  ?Last 3 Weights  ?Weight (lbs) 181 lb 14.1 oz 186 lb 8.2 oz 193 lb 9 oz  ?Weight (kg) 82.5 kg 84.6 kg 87.8 kg  ?   ? ?Telemetry  ?  ?Sinus rhythm 90s- Personally Reviewed ? ?ECG  ?  ? ?Sinus tach 137 left branch block, prior EKG July 2022 showed left bundle branch block- Personally Reviewed ? ?Physical Exam  ? ?GEN: No acute distress.   ?Neck: No JVD ?Cardiac: RRR, no murmurs, rubs, or gallops.  Cath site intact ?Respiratory: Clear to auscultation bilaterally. ?GI: Soft, nontender, non-distended  ?MS: No edema; No deformity. ?Neuro:  Nonfocal  ?Psych: Normal affect   ? ?Labs  ?  ?High Sensitivity Troponin:   ?Recent Labs  ?Lab 10/09/21 ?1752 10/09/21 ?1941  ?TROPONINIHS 14 15  ?   ?Chemistry ?Recent Labs  ?Lab 10/09/21 ?1752 10/09/21 ?1941 10/10/21 ?0106 10/11/21 ?0037  ?NA 140  --  140 139  ?K 4.0  --  3.6 3.5  ?CL 111  --  108 103  ?CO2 21*  --  24 24  ?GLUCOSE 115*  --  107* 107*  ?BUN 16  --  14 13  ?CREATININE 1.03* 0.90 0.80 0.85  ?CALCIUM 8.8*  --  8.6* 9.1  ?GFRNONAA >60 >60 >60 >60  ?ANIONGAP 8  --  8 12  ?  ?Lipids  ?Recent Labs  ?Lab 10/11/21 ?0037  ?CHOL 137  ?TRIG 64  ?HDL 41  ?[place] 83  ?CHOLHDL 3.3  ?  ?Hematology ?Recent Labs  ?Lab 10/09/21 ?1752 10/10/21 ?0106  ?WBC 6.9 5.8  ?RBC 4.73 4.47  ?HGB 13.3 12.4  ?HCT 40.0 37.4  ?MCV 84.6 83.7  ?MCH 28.1 27.7  ?MCHC 33.3 33.2  ?RDW 14.4 14.2  ?PLT 296 279  ? ?Thyroid  ?Recent Labs  ?Lab 10/09/21 ?1941  ?TSH 1.036  ?  ?  BNP ?Recent Labs  ?Lab 10/09/21 ?1752  ?BNP 437.3*  ?  ?DDimer No results for input(s): DDIMER in the last 168 hours.  ? ?Radiology  ?  ?CT Angio Chest Pulmonary Embolism (PE) W or WO Contrast ? ?Result Date: 10/09/2021 ?CLINICAL DATA:  Pulmonary embolism (PE) suspected, unknown D-dimer Chest pain and shortness of breath. EXAM: CT ANGIOGRAPHY CHEST WITH CONTRAST TECHNIQUE: Multidetector CT imaging of the chest was performed using the standard protocol during bolus administration of intravenous contrast. Multiplanar CT image reconstructions and MIPs were obtained to evaluate the vascular anatomy. RADIATION DOSE REDUCTION: This exam was performed according to the departmental dose-optimization program which includes automated exposure control, adjustment of the mA and/or kV according to patient size and/or use of iterative reconstruction technique. CONTRAST:  58mL OMNIPAQUE IOHEXOL 350 MG/ML SOLN COMPARISON:  Radiograph earlier today. FINDINGS: Cardiovascular: There are no filling defects within the pulmonary arteries to suggest pulmonary embolus. Multi chamber cardiomegaly. Contrast refluxes into the  hepatic veins and IVC. The thoracic aorta is normal in caliber. Mediastinum/Nodes: Shotty mediastinal and hilar lymph nodes are typically reactive. Calcified right hilar nodes consistent with prior granulomatous disease. Patulous esophagus without wall thickening. No suspicious thyroid nodule. Lungs/Pleura: Small to moderate right and small left pleural effusion. Associated compressive atelectasis. There is mild septal thickening and generalized hazy lung density suggesting pulmonary edema. Calcified granuloma in the right lower lobe. No pulmonary mass. Upper Abdomen: Contrast refluxes into the hepatic veins and IVC. No other acute findings. Musculoskeletal: There are no acute or suspicious osseous abnormalities. Review of the MIP images confirms the above findings. IMPRESSION: 1. No pulmonary embolus. 2. CHF. Cardiomegaly with bilateral pleural effusions and pulmonary edema. Electronically Signed   By: Keith Rake M.D.   On: 10/09/2021 23:37  ? ?CARDIAC CATHETERIZATION ? ?Result Date: 10/11/2021 ?1.  Normal right dominant circulation 2.  Cardiac output of 11 L/min and an index of 6 L/min/m? with mean RA pressure of 1 mmHg and mean wedge pressure of 7 mmHg; LVEDP was 11 mmHg. 3.  Ejection fraction of around 30% on ventriculography. Recommendation: Goal-directed medical therapy.  ? ?DG Chest Portable 1 View ? ?Result Date: 10/09/2021 ?CLINICAL DATA:  Shortness of breath, chest pain EXAM: PORTABLE CHEST 1 VIEW COMPARISON:  01/10/2020 FINDINGS: Transverse diameter of heart is increased. There is abnormal prominence of central pulmonary vessels. Increased interstitial and alveolar markings are seen in the parahilar regions and lower lung fields. There is blunting of both lateral CP angles. There is no pneumothorax. IMPRESSION: Cardiomegaly. CHF. Pulmonary edema. Possibility of underlying pneumonia is not excluded. Follow-up chest radiographs should be considered to assess resolution. Small bilateral pleural  effusions. Electronically Signed   By: Elmer Picker M.D.   On: 10/09/2021 18:43  ? ?ECHOCARDIOGRAM COMPLETE ? ?Result Date: 10/10/2021 ?   ECHOCARDIOGRAM REPORT   Patient Name:   DAILY PROVINS Date of Exam: 10/10/2021 Medical Rec #:  [id number]  Height:       64.0 in Accession #:    [id number] Weight:       186.5 lb Date of Birth:  March 29, 1966 BSA:          1.899 m? Patient Age:    23 years   BP:           102/76 mmHg Patient Gender: F          HR:           98 bpm. Exam Location:  Inpatient Procedure: 2D Echo Indications:  Acute systolic CHF  History:        Patient has no prior history of Echocardiogram examinations.  Sonographer:    Arlyss Gandy Referring Phys: [id number] JAN A [place]  1. Left ventricular ejection fraction, by estimation, is 20 to 25%. The left ventricle has severely decreased function. The left ventricle demonstrates global hypokinesis. The left ventricular internal cavity size was severely dilated. Left ventricular diastolic parameters are consistent with Grade II diastolic dysfunction (pseudonormalization). Elevated left ventricular end-diastolic pressure.  2. Right ventricular systolic function is normal. The right ventricular size is normal. There is normal pulmonary artery systolic pressure.  3. Left atrial size was moderately dilated.  4. Moderate pleural effusion in the left lateral region.  5. The mitral valve is normal in structure. Mild mitral valve regurgitation. No evidence of mitral stenosis.  6. The aortic valve is tricuspid. Aortic valve regurgitation is not visualized. No aortic stenosis is present.  7. The inferior vena cava is normal in size with greater than 50% respiratory variability, suggesting right atrial pressure of 3 mmHg. FINDINGS  Left Ventricle: Left ventricular ejection fraction, by estimation, is 20 to 25%. The left ventricle has severely decreased function. The left ventricle demonstrates global hypokinesis. The left ventricular internal cavity size was  severely dilated. There is no left ventricular hypertrophy. Left ventricular diastolic parameters are consistent with Grade II diastolic dysfunction (pseudonormalization). Elevated left ventricular end-diastolic

## 2021-10-11 NOTE — Interval H&P Note (Signed)
History and Physical Interval Note: ? ?10/11/2021 ?7:07 AM ? ?Kambrie Eddleman  has presented today for surgery, with the diagnosis of HF.  The various methods of treatment have been discussed with the patient and family. After consideration of risks, benefits and other options for treatment, the patient has consented to  Procedure(s): ?RIGHT/LEFT HEART CATH AND CORONARY ANGIOGRAPHY (N/A) as a surgical intervention.  The patient's history has been reviewed, patient examined, no change in status, stable for surgery.  I have reviewed the patient's chart and labs.  Questions were answered to the patient's satisfaction.   ? ?Cath Lab Visit (complete for each Cath Lab visit) ? ?Clinical Evaluation Leading to the Procedure:  ? ?ACS: No. ? ?Non-ACS:   ? ?Anginal Classification: No Symptoms ? ?Anti-ischemic medical therapy: Minimal Therapy (1 class of medications) ? ?Non-Invasive Test Results: No non-invasive testing performed ? ?Prior CABG: No previous CABG ? ? ? ? ? ? ? ?Orbie Pyo ? ? ?

## 2021-10-11 NOTE — TOC Initial Note (Addendum)
Transition of Care (TOC) - Initial/Assessment Note  ? ? ?Patient Details  ?Name: Lori Mcbride ?MRN: [id number] ?Date of Birth: 1966/06/27 ? ?Transition of Care [facility]) CM/SW Contact:    ?Marcheta Grammes Rexene Alberts, RN ?Phone Number: [phone] ?10/11/2021, 2:25 PM ? ?Clinical Narrative:                 ? ? ?HF TOC CM spoke to pt and husband at bedside. 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 Pt report she does have a scale at home and follows a healthy diet. Provided pt with Farxiga copay card $0. Scheduled appt at [facility] at [facility] on 10/15/2021 at 1:00 pm. Updated pt with information.  ? ? ?HF TOC CM provided pt with a Farxiga copay card $0. Pt meds will come up from [place] and if dc on weekend, will be stored in pharmacy until dc.  ? ?Pt has meds up from [place] at bedside. Provided pt with brochure for Primary Care at [facility] with appt information.  ? ?Expected Discharge Plan: Home/Self Care ?Barriers to Discharge: Continued Medical Work up ? ? ?Patient Goals and CMS Choice ?Patient states their goals for this hospitalization and ongoing recovery are:: wants to get back to baseline ?CMS Medicare.gov Compare Post Acute Care list provided to:: Patient ?  ? ?Expected Discharge Plan and Services ?Expected Discharge Plan: Home/Self Care ?  ?Discharge Planning Services: CM Consult, Medication Assistance, Follow-up appt scheduled ?  ?Living arrangements for the past 2 months: [place] ?                ?  ?  ?  ?  ?  ?  ?  ?  ?  ?  ? ?Prior Living Arrangements/Services ?Living arrangements for the past 2 months: [place] ?Lives with:: Spouse ?Patient language and need for interpreter reviewed:: Yes ?Do you feel safe going back to the place where you live?: Yes      ?Need for Family Participation in Patient Care: No (Comment) ?Care giver support system in place?: No (comment) ?  ?  ? ?Activities of Daily Living ?  ?  ? ?Permission Sought/Granted ?Permission  sought to share information with : Case Manager, Family Supports, PCP ?Permission granted to share information with : Yes, Verbal Permission Granted ? Share Information with NAME: Avamae Hauskins ? Permission granted to share info w AGENCY: PCP ? Permission granted to share info w Relationship: husband ? Permission granted to share info w Contact Information: [phone] ? ?Emotional Assessment ?Appearance:: Appears stated age ?Attitude/Demeanor/Rapport: Engaged ?Affect (typically observed): Accepting ?Orientation: : Oriented to Self, Oriented to Place, Oriented to  Time, Oriented to Situation ?  ?Psych Involvement: No (comment) ? ?Admission diagnosis:  Acute pulmonary edema ([place]) [J81.0] ?Pleural effusion [J90] ?Acute CHF (congestive heart failure) ([facility]) [I50.9] ?Heart failure, unspecified HF chronicity, unspecified heart failure type ([place]) [I50.9] ?Patient Active Problem List  ? Diagnosis Date Noted  ? Acute CHF (congestive heart failure) ([place]) 10/09/2021  ? Peptic ulcer disease 10/09/2021  ? Seasonal allergies 10/09/2021  ? GI bleed 01/12/2020  ? Acute upper GI bleed 01/11/2020  ? Hyperglycemia 01/11/2020  ? ?PCP:  No primary care provider on file. ?Pharmacy:   ?[id number] [place] [place] - [address] ?[address] ?[facility] [place] [address] ?Phone: [phone] Fax: [phone] ? ?[facility] ?[address] ?[place] [address] ?Phone: [phone]  Fax: [phone] ? ? ? ? ?Social Determinants of Health (SDOH) Interventions ?Food Insecurity Interventions: Intervention Not Indicated ?Financial Strain Interventions: Intervention Not Indicated ?Housing Interventions: Intervention Not Indicated ? ?Readmission Risk Interventions ?   ? View : No data to display.  ?  ?  ?  ? ? ? ?

## 2021-10-12 DIAGNOSIS — K279 Peptic ulcer, site unspecified, unspecified as acute or chronic, without hemorrhage or perforation: Secondary | ICD-10-CM | POA: Diagnosis not present

## 2021-10-12 DIAGNOSIS — E876 Hypokalemia: Secondary | ICD-10-CM

## 2021-10-12 DIAGNOSIS — I5021 Acute systolic (congestive) heart failure: Secondary | ICD-10-CM | POA: Diagnosis not present

## 2021-10-12 DIAGNOSIS — J302 Other seasonal allergic rhinitis: Secondary | ICD-10-CM | POA: Diagnosis not present

## 2021-10-12 LAB — BASIC METABOLIC PANEL
Anion gap: 11 (ref 5–15)
BUN: 15 mg/dL (ref 6–20)
CO2: 27 mmol/L (ref 22–32)
Calcium: 9.1 mg/dL (ref 8.9–10.3)
Chloride: 102 mmol/L (ref 98–111)
Creatinine, Ser: 0.86 mg/dL (ref 0.44–1.00)
GFR, Estimated: >60 mL/min (ref >60)
Glucose, Bld: 110 mg/dL — ABNORMAL HIGH (ref 70–99)
Potassium: 3.3 mmol/L — ABNORMAL LOW (ref 3.5–5.1)
Sodium: 140 mmol/L (ref 135–145)

## 2021-10-12 LAB — MAGNESIUM: Magnesium: 2.3 mg/dL (ref 1.7–2.4)

## 2021-10-12 MED ORDER — METOPROLOL SUCCINATE ER 50 MG PO TB24
50.0000 mg | ORAL_TABLET | Freq: Every day | ORAL | Status: DC
Start: 2021-10-12 — End: 2021-10-14
  Administered 2021-10-12 – 2021-10-14 (×3): 50 mg via ORAL
  Filled 2021-10-12 (×3): qty 1

## 2021-10-12 MED ORDER — POTASSIUM CHLORIDE CRYS ER 20 MEQ PO TBCR
40.0000 meq | EXTENDED_RELEASE_TABLET | Freq: Once | ORAL | Status: AC
  Administered 2021-10-12: 40 meq via ORAL
  Filled 2021-10-12: qty 2

## 2021-10-12 NOTE — Plan of Care (Signed)
?  Problem: Clinical Measurements: ?Goal: Ability to maintain clinical measurements within normal limits will improve ?Outcome: Progressing ?Goal: Will remain free from infection ?Outcome: Progressing ?Goal: Diagnostic test results will improve ?Outcome: Progressing ?  ?

## 2021-10-12 NOTE — Discharge Summary (Addendum)
?Physician Discharge Summary ?  ?Patient: Lori Mcbride MRN: [id number] DOB: January 04, 1966  ?Admit date:     10/09/2021  ?Discharge date: 10/14/21  ?Discharge Physician: Jimmy Picket Jashan Cotten  ? ?PCP: No primary care provider on file.  ? ?Recommendations at discharge:  ? ? Patient has been placed on heart failure regimen with metoprolol succinate, spironolactone and dapagliflozin ?Diuresis with furosemide. ?Continue with Kcl supplementation ?Follow up renal function as outpatient in 7 to 10 days ?Follow up with heart failure clinic on 10/22/21.  ?Follow up with primary care in 7 to 10 days.  ?Patient placed on ivabardine for tachycardia.  ? ?Discharge Diagnoses: ?Principal Problem: ?  Acute CHF (congestive heart failure) ([place]) ?Active Problems: ?  Peptic ulcer disease ?  Seasonal allergies ?  Hypokalemia ? ?Resolved Problems: ?  * No resolved hospital problems. * ? ?Hospital Course: ?Mrs. Fetterman was admitted to the hospital with the working diagnosis of decompensated heart failure.  ? ?56 yo female with the past medical history of GERD and GI bleed who presented with dyspnea. She reported worsening symptoms for 2 weeks, as outpatient she was treated for allergies and pneumonia with no improvement in her symptoms. Positive orthopnea, PND and cough. On her initial physical examination her blood pressure was 104/84, HR 140, RR 15, and 02 saturation 93%, decreased breath sounds and rales at bases, heart with S1 and S2 present and rhythmic, abdomen soft and positive lower extremity edema +.  ? ?Na 140, K 4,0, CL 111, bicarbonate 21, glucose 115 bun 16 cr 1,0  ?BNP 437  ?High sensitive troponin 14 and 15  ?Wbc 9,0, hgb 13,3 hct 40 and plt 296  ?Sars covid 19 negative.  ? ?Chest radiograph with cardiomegaly with bilateral hilar vascular congestion. ?CT chest with faint bilateral ground glass opacities, with small bilateral pleural effusions. No pulmonary embolism.  ? ?EKG 137 bpm, right axis deviation, left bundle branch block,  sinus rhythm with Q1 V1, ST depression and T wave inversion in lead II, III, AvF. Positive LVH.  ? ?Patient was placed on diuretic therapy with good response ? ?Echocardiogram with reduced LV systolic function, cardiac catheterization with non ischemic cardiomyopathy with wedge pressure 7. ? ?Patient placed on metoprolol succinate, SGLT1i, and spironolactone. ?No entresto due to risk of hypotension.  ? ?Her discharge was delayed due to persistent tachycardia, metoprolol was increased to 50 mg with good toleration.  ?Now started on ivabradine for better rate control.  ? ? ? ? ? ?Assessment and Plan: ?* Acute CHF (congestive heart failure) ([place]) ?Acute cardiogenic pulmonary edema.  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  ? ?Patient was admitted to the cardiac ward and was placed on IV furosemide for diuresis, negative fluid balance was achieved, -4.210 ml, with significant improvement of her symptoms.  ? ?Further work up with: ?Echocardiogram with LV EF 20 to 25%, with global hypokinesis, elevated end diastolic pressure, RV with preserved systolic function, moderate left atrial dilatation. No significant valvular disease.  ? ?Cardiac catheterization with no significant coronary artery disease, normal right dominant circulation. Cardiac output 11 L./min and index 6/L, wedge pressure 7 and LVEDP 11. ?Ejection fraction left ventricle 30%.  ? ? ?Patient will continue heart failure management with metoprolol succinate, dapagliflozin and spironolactone.  ?Daily furosemide to keep negative fluid balance.  ?Plan to start patient on Entresto as outpatient, currently on hold to prevent hypotension.  ? ?Because persistent tachycardia metoprolol has been up titrated to 50 mg daily and  ivabradine for better heart rate control.  ? ?Peptic ulcer disease ?Continue with pantoprazole. ?No clinical signs of recurrent bleeding.  ? ?Hypokalemia ?Renal function stable with serum cr at 0,82, K is  3,7 and serum bicarbonate at 28. ?Continue diuresis with furosemide and spironolactone. ?Continue with K supplementation.  ? ?Seasonal allergies ?Continue with nasal steroids for now.  ? ? ? ? ?  ? ? ?Consultants: cardiology  ?Procedures performed: heart catheterization   ?Disposition: Home ?Diet recommendation:  ?Cardiac diet ?DISCHARGE MEDICATION: ?Allergies as of 10/13/2021   ?No Known Allergies ?  ? ?  ?Medication List  ?  ? ?STOP taking these medications   ? ?ferrous sulfate 325 (65 FE) MG tablet ?  ? ?  ? ?TAKE these medications   ? ?benzonatate 100 MG capsule ?Commonly known as: TESSALON ?Take by mouth 3 (three) times daily as needed for cough. ?  ?Farxiga 10 MG Tabs tablet ?Generic drug: dapagliflozin propanediol ?Take 1 tablet (10 mg total) by mouth daily. ?  ?fluticasone 27.5 MCG/SPRAY nasal spray ?Commonly known as: VERAMYST ?Place 2 sprays into the nose daily. ?  ?furosemide 20 MG tablet ?Commonly known as: LASIX ?Take 1 tablet (20 mg total) by mouth daily. ?  ?metoprolol succinate 50 MG 24 hr tablet ?Commonly known as: TOPROL-XL ?Take 1 tablet (50 mg total) by mouth daily. Take with or immediately following a meal. ?  ?pantoprazole 20 MG tablet ?Commonly known as: PROTONIX ?Take 1 tablet (20 mg total) by mouth daily. ?  ?potassium chloride 10 MEQ tablet ?Commonly known as: KLOR-CON M ?Take 1 tablet (10 mEq total) by mouth daily. ?  ?promethazine-dextromethorphan 6.25-15 MG/5ML syrup ?Commonly known as: PROMETHAZINE-DM ?Take 5 mLs by mouth 4 (four) times daily as needed for cough. ?  ?spironolactone 25 MG tablet ?Commonly known as: ALDACTONE ?Take 0.5 tablets (12.5 mg total) by mouth daily. ?  ? ?  ? ? ?Discharge Exam: ?Filed Weights  ? 10/10/21 0529 10/11/21 0349 10/12/21 0705  ?Weight: 84.6 kg 82.5 kg 81.2 kg  ? ?BP 101/67 (BP Location: Left Arm)   Pulse (!) 101   Temp 97.8 ?F (36.6 ?C) (Oral)   Resp 17   Ht 5\' 4"  (1.626 m)   Wt 81.2 kg   LMP 09/05/2021 (Approximate)   SpO2 96%   BMI 30.73 kg/m?   ? ?Patient is feeling well, no dyspnea or edema ? ?Neurology awake and alert ?ENT with no pallor ?Cardiovascular with S1 and S2 present and rhythmic with no gallops or murmurs.  ?No JVD ?Trace lower extremity edema ?Respiratory with no rales or wheezing ?Abdomen soft and not distended.  ? ?Condition at discharge: stable ? ?The results of significant diagnostics from this hospitalization (including imaging, microbiology, ancillary and laboratory) are listed below for reference.  ? ?Imaging Studies: ?CT Angio Chest Pulmonary Embolism (PE) W or WO Contrast ? ?Result Date: 10/09/2021 ?CLINICAL DATA:  Pulmonary embolism (PE) suspected, unknown D-dimer Chest pain and shortness of breath. EXAM: CT ANGIOGRAPHY CHEST WITH CONTRAST TECHNIQUE: Multidetector CT imaging of the chest was performed using the standard protocol during bolus administration of intravenous contrast. Multiplanar CT image reconstructions and MIPs were obtained to evaluate the vascular anatomy. RADIATION DOSE REDUCTION: This exam was performed according to the departmental dose-optimization program which includes automated exposure control, adjustment of the mA and/or kV according to patient size and/or use of iterative reconstruction technique. CONTRAST:  75mL OMNIPAQUE IOHEXOL 350 MG/ML SOLN COMPARISON:  Radiograph earlier today. FINDINGS: Cardiovascular: There are no filling defects  within the pulmonary arteries to suggest pulmonary embolus. Multi chamber cardiomegaly. Contrast refluxes into the hepatic veins and IVC. The thoracic aorta is normal in caliber. Mediastinum/Nodes: Shotty mediastinal and hilar lymph nodes are typically reactive. Calcified right hilar nodes consistent with prior granulomatous disease. Patulous esophagus without wall thickening. No suspicious thyroid nodule. Lungs/Pleura: Small to moderate right and small left pleural effusion. Associated compressive atelectasis. There is mild septal thickening and generalized hazy lung  density suggesting pulmonary edema. Calcified granuloma in the right lower lobe. No pulmonary mass. Upper Abdomen: Contrast refluxes into the hepatic veins and IVC. No other acute findings. Musculoskeletal: There

## 2021-10-12 NOTE — Assessment & Plan Note (Addendum)
Renal function stable with serum cr at 0,82, K is 3,7 and serum bicarbonate at 28. ?Continue diuresis with furosemide and spironolactone. ?Continue with K supplementation.  ?

## 2021-10-12 NOTE — Progress Notes (Signed)
Noted persistent sinus tachycardia with HR 120's at rest.  ? ?Plan to cancel discharge and increase metoprolol succinate to 50 mg  ?Continue telemetry monitoring.  ?Follow with cardiology recommendations.  ?

## 2021-10-13 DIAGNOSIS — J302 Other seasonal allergic rhinitis: Secondary | ICD-10-CM | POA: Diagnosis not present

## 2021-10-13 DIAGNOSIS — K279 Peptic ulcer, site unspecified, unspecified as acute or chronic, without hemorrhage or perforation: Secondary | ICD-10-CM | POA: Diagnosis not present

## 2021-10-13 DIAGNOSIS — I471 Supraventricular tachycardia: Secondary | ICD-10-CM | POA: Diagnosis not present

## 2021-10-13 DIAGNOSIS — E876 Hypokalemia: Secondary | ICD-10-CM | POA: Diagnosis not present

## 2021-10-13 DIAGNOSIS — I5021 Acute systolic (congestive) heart failure: Secondary | ICD-10-CM | POA: Diagnosis not present

## 2021-10-13 LAB — BASIC METABOLIC PANEL
Anion gap: 7 (ref 5–15)
BUN: 17 mg/dL (ref 6–20)
CO2: 28 mmol/L (ref 22–32)
Calcium: 9 mg/dL (ref 8.9–10.3)
Chloride: 104 mmol/L (ref 98–111)
Creatinine, Ser: 0.82 mg/dL (ref 0.44–1.00)
GFR, Estimated: >60 mL/min (ref >60)
Glucose, Bld: 111 mg/dL — ABNORMAL HIGH (ref 70–99)
Potassium: 3.7 mmol/L (ref 3.5–5.1)
Sodium: 139 mmol/L (ref 135–145)

## 2021-10-13 MED ORDER — METOPROLOL SUCCINATE ER 50 MG PO TB24
50.0000 mg | ORAL_TABLET | Freq: Every day | ORAL | 0 refills | Status: DC
  Filled 2021-10-13: qty 30, 30d supply, fill #0

## 2021-10-13 MED ORDER — IVABRADINE HCL 5 MG PO TABS
5.0000 mg | ORAL_TABLET | Freq: Two times a day (BID) | ORAL | Status: DC
  Administered 2021-10-13 – 2021-10-14 (×2): 5 mg via ORAL
  Filled 2021-10-13 (×3): qty 1

## 2021-10-13 NOTE — Plan of Care (Signed)

## 2021-10-13 NOTE — Progress Notes (Signed)
?Progress Note ? ? ?Patient: Anecia Serven [id number] DOB: [birth year] DOA: 10/09/2021     4 ?DOS: the patient was seen and examined on 10/13/2021 ?  ?Brief hospital course: ?Mrs. Wormwood was admitted to the hospital with the working diagnosis of decompensated heart failure.  ? ?56 yo female with the past medical history of GERD and GI bleed who presented with dyspnea. She reported worsening symptoms for 2 weeks, as outpatient she was treated for allergies and pneumonia with no improvement in her symptoms. Positive orthopnea, PND and cough. On her initial physical examination her blood pressure was 104/84, HR 140, RR 15, and 02 saturation 93%, decreased breath sounds and rales at bases, heart with S1 and S2 present and rhythmic, abdomen soft and positive lower extremity edema +.  ? ?Na 140, K 4,0, CL 111, bicarbonate 21, glucose 115 bun 16 cr 1,0  ?BNP 437  ?High sensitive troponin 14 and 15  ?Wbc 9,0, hgb 13,3 hct 40 and plt 296  ?Sars covid 19 negative.  ? ?Chest radiograph with cardiomegaly with bilateral hilar vascular congestion. ?CT chest with faint bilateral ground glass opacities, with small bilateral pleural effusions. No pulmonary embolism.  ? ?EKG 137 bpm, right axis deviation, left bundle branch block, sinus rhythm with Q1 V1, ST depression and T wave inversion in lead II, III, AvF. Positive LVH.  ? ?Patient was placed on diuretic therapy with good response ? ?Echocardiogram with reduced LV systolic function, cardiac catheterization with non ischemic cardiomyopathy with wedge pressure 7. ? ?Patient placed on metoprolol succinate, SGLT1i, and spironolactone. ?No entresto due to risk of hypotension.  ? ?Her discharge was delayed due to persistent tachycardia, metoprolol was increased to 50 mg with good toleration.  ?Now started on ivabradine for better rate control.  ? ? ? ? ? ?Assessment and Plan: ?* Acute CHF (congestive heart failure) ([place]) ?Acute cardiogenic pulmonary edema.  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  ? ?Patient was admitted to the cardiac ward and was placed on IV furosemide for diuresis, negative fluid balance was achieved, -4.210 ml, with significant improvement of her symptoms.  ? ?Further work up with: ?Echocardiogram with LV EF 20 to 25%, with global hypokinesis, elevated end diastolic pressure, RV with preserved systolic function, moderate left atrial dilatation. No significant valvular disease.  ? ?Cardiac catheterization with no significant coronary artery disease, normal right dominant circulation. Cardiac output 11 L./min and index 6/L, wedge pressure 7 and LVEDP 11. ?Ejection fraction left ventricle 30%.  ? ? ?Patient will continue heart failure management with metoprolol succinate, dapagliflozin and spironolactone.  ?Daily furosemide to keep negative fluid balance.  ?Plan to start patient on Entresto as outpatient, currently on hold to prevent hypotension.  ? ?Because persistent tachycardia metoprolol has been up titrated to 50 mg daily. ?Today added ivabradine for better heart rate control.  ? ?Peptic ulcer disease ?Continue with pantoprazole. ?No clinical signs of recurrent bleeding.  ? ?Hypokalemia ?Renal function stable with serum cr at 0,82, K is 3,7 and serum bicarbonate at 28. ?Continue diuresis with furosemide and spironolactone. ?Continue with K supplementation.  ? ?Seasonal allergies ?Continue with nasal steroids for now.  ? ? ? ? ?  ? ?Subjective: patient is feeling better, her dyspnea and edema have improved, no palpitations.  ? ?Physical Exam: ?Vitals:  ? 10/13/21 0356 10/13/21 0755 10/13/21 1121 10/13/21 1145  ?BP:  100/66 102/63 102/63  ?Pulse:  89 97 97  ?Resp:  (!) 27 (!) 23   ?  Temp:  97.9 ?F (36.6 ?C) 97.9 ?F (36.6 ?C)   ?TempSrc:  Oral Oral   ?SpO2:  95% 95%   ?Weight: 81.3 kg     ?Height:      ? ?Neurology awake and alert ?ENT with no pallor ?Cardiovascular with S1 and S2 present with no gallops, rubs or murmurs ?No JVD ?No  lower extremity edema ?Respiratory with no rales or wheezing ?Abdomen not distended  ?Data Reviewed: ? ? ? ?Family Communication: I spoke with patient's son at the bedside, we talked in detail about patient's condition, plan of care and prognosis and all questions were addressed. ? ? ?Disposition: ?Status is: Inpatient ?Remains inpatient appropriate because: possible dc home tomorrow depending on heart rate.  ? Planned Discharge Destination: Home ? ? ? ?Author: ?Tawni Millers, MD ?10/13/2021 3:43 PM ? ?For on call review [email or web address].  ?

## 2021-10-13 NOTE — Progress Notes (Signed)
0808 Notified by tele - Pt has Qtc elongation.  ?[id number] Dr Cathlean Sauer notified.  ?

## 2021-10-13 NOTE — Progress Notes (Signed)
Patient is feeling better, no dyspnea or chest pain. ? ?BP 100/66 (BP Location: Right Arm)   Pulse 89   Temp 97.9 ?F (36.6 ?C) (Oral)   Resp (!) 27   Ht 5\' 4"  (1.626 m)   Wt 81.3 kg   LMP 09/05/2021 (Approximate)   SpO2 95%   BMI 30.77 kg/m?  ? ?Neurology awake and alert ?ENT with no pallor ?Cardiovascular with S1 and S2 present with no gallops, rubs or murmurs.  ?No JVD ?No lower extremity edema ?Respiratory with no wheezing or rales ?Abdomen not distended ? ?Plan. ?Systolic heart failure.  ?Patient is tolerating well increased dose of metoprolol 50 mg succinate.  ?Plan to discharge home today and follow up with TOC heart failure as outpatient  ?

## 2021-10-13 NOTE — Progress Notes (Signed)
[facility] notification that pt's QTc is >500.  ?0936 Bhagat PA paged.  ?

## 2021-10-13 NOTE — Progress Notes (Signed)
? ?Progress Note ? ?Patient Name: Lori Mcbride ?Date of Encounter: 10/13/2021 ? ?[facility] Cardiologist: None  ? ? ?Patient Profile  ?   ?56 y.o. female with nonischemic cardiomyopathy, ejection fraction 20 to 25%, left bundle branch block. Cath personally reviewed.  ?Tachycardia ? ?Subjective  ? ?Patient had discharge orders yesterday; not seen.  Discharge was canceled because of tachycardia ?History is not so clear; she has had tachycardia on all of her tracings dating back to 2017.  Has been told she has had rapid heartbeats she apparently saw cardiologist near here --no records available ?Dyspnea and some loss of energy over the last 6-12 months with some edema. ?Today with some dyspnea but better ? ? ?Inpatient Medications  ?  ?Scheduled Meds: ? dapagliflozin propanediol  10 mg Oral Daily  ? enoxaparin (LOVENOX) injection  40 mg Subcutaneous Q24H  ? fluticasone  2 spray Each Nare Daily  ? furosemide  20 mg Oral Daily  ? metoprolol succinate  50 mg Oral Daily  ? pantoprazole  20 mg Oral Daily  ? potassium chloride  10 mEq Oral Daily  ? sodium chloride flush  3 mL Intravenous Q12H  ? sodium chloride flush  3 mL Intravenous Q12H  ? sodium chloride flush  3 mL Intravenous Q12H  ? spironolactone  12.5 mg Oral Daily  ? ?Continuous Infusions: ? sodium chloride    ? sodium chloride    ? ?PRN Meds: ?sodium chloride, sodium chloride, acetaminophen, ALPRAZolam, magnesium hydroxide, ondansetron (ZOFRAN) IV, sodium chloride flush, sodium chloride flush, traZODone  ? ?Vital Signs  ?  ?Vitals:  ? 10/12/21 2344 10/13/21 [id number] 10/13/21 0356 10/13/21 0755  ?BP: 102/66 99/62  100/66  ?Pulse: (!) 104 92  89  ?Resp: 20 14  (!) 27  ?Temp: 98.5 ?F (36.9 ?C) 98.4 ?F (36.9 ?C)  97.9 ?F (36.6 ?C)  ?TempSrc: Oral Oral  Oral  ?SpO2: 96% 93%  95%  ?Weight:   81.3 kg   ?Height:      ? ? ?Intake/Output Summary (Last 24 hours) at 10/13/2021 1024 ?Last data filed at 10/13/2021 [id number] ?Gross per 24 hour  ?Intake 480 ml  ?Output --  ?Net 480 ml   ? ? ? ?  10/13/2021  ?  3:56 AM 10/12/2021  ?  7:05 AM 10/11/2021  ?  3:49 AM  ?Last 3 Weights  ?Weight (lbs) 179 lb 3.7 oz 179 lb 0.2 oz 181 lb 14.1 oz  ?Weight (kg) 81.3 kg 81.2 kg 82.5 kg  ?   ? ?Telemetry  ? Telemetry Personally reviewed tachycardia with rates ranging from 90--130.  There are some variations in P wave morphology but not distinctly associated with changes in rate.  When there is a P wave morphology change least on some occasions there is acceleration of the rhythm. ? ?ECG  ?ECGs reviewed, all in the system, left bundle branch block rapid rates.  Short PR interval for at least on some distinct isoelectric PR segment ?Physical Exam  ?Well developed and nourished in no acute distress ?HENT normal ?Neck supple with JVP-8-10 cm ?Clear ?Regular rate and rhythm, no murmurs or gallops ?Abd-soft with active BS ?No Clubbing cyanosis trace edema ?Skin-warm and dry ?A & Oriented  Grossly normal sensory and motor function ? ?ECG    ? ?Labs  ?  ?High Sensitivity Troponin:   ?Recent Labs  ?Lab 10/09/21 ?1752 10/09/21 ?1941  ?TROPONINIHS 14 15  ? ?   ?Chemistry ?Recent Labs  ?Lab 10/11/21 ?[id number] 10/12/21 ?0052 10/13/21 ?  0120  ?NA 139 140 139  ?K 3.5 3.3* 3.7  ?CL 103 102 104  ?CO2 24 27 28   ?GLUCOSE 107* 110* 111*  ?BUN 13 15 17   ?CREATININE 0.85 0.86 0.82  ?CALCIUM 9.1 9.1 9.0  ?MG  --  2.3  --   ?GFRNONAA >60 >60 >60  ?ANIONGAP 12 11 7   ? ?  ?Lipids  ?Recent Labs  ?Lab 10/11/21 ?0037  ?CHOL 137  ?TRIG 64  ?HDL 41  ?[place] 83  ?CHOLHDL 3.3  ? ?  ?Hematology ?Recent Labs  ?Lab 10/09/21 ?1752 10/10/21 ?0106  ?WBC 6.9 5.8  ?RBC 4.73 4.47  ?HGB 13.3 12.4  ?HCT 40.0 37.4  ?MCV 84.6 83.7  ?MCH 28.1 27.7  ?MCHC 33.3 33.2  ?RDW 14.4 14.2  ?PLT 296 279  ? ? ?Thyroid  ?Recent Labs  ?Lab 10/09/21 ?1941  ?TSH 1.036  ? ?  ?BNP ?Recent Labs  ?Lab 10/09/21 ?1752  ?BNP 437.3*  ? ?  ?DDimer No results for input(s): DDIMER in the last 168 hours.  ? ?Radiology  ?  ?No results found. ? ?Cardiac Studies  ? ?Cardiac catheterization-no  CAD, excellent cardiac output 11 ? ? ?Assessment & Plan  ?  ? ?Nonischemic cardiomyopathy ? ?Left bundle branch block ? ?Hypotension-relative ? ?Tachycardia ? ?Her tachycardia has been persistent for some years.  I am concerned that it may be contributing to her cardiomyopathy and the slight changes in P wave morphology suggest the possibility of an atrial tachycardia although I am not convinced.  Hence, we will begin her on ivabradine to see if we can slow her heart rate down.  We will start at 5 mg twice daily. ? ?Would have low threshold -- for amio and or EPS for arhythmia mapping  ? ?Right now the blood pressure precludes more beta-blocker, but if we get no help with the ivabradine, would discontinue the spironolactone as the most important issue potentially his heart rate slowing. ? ?CRT is of some potential benefit--but will need  ? ? ?  ? ?For questions or updates, please contact [place] ?Please consult [email or web address] for contact info under  ? ?  ?   ?Signed, ?Virl Axe, MD  ?10/13/2021, 10:24 AM   ? ?

## 2021-10-14 ENCOUNTER — Other Ambulatory Visit (HOSPITAL_COMMUNITY): Payer: Self-pay

## 2021-10-14 DIAGNOSIS — J302 Other seasonal allergic rhinitis: Secondary | ICD-10-CM | POA: Diagnosis not present

## 2021-10-14 DIAGNOSIS — K279 Peptic ulcer, site unspecified, unspecified as acute or chronic, without hemorrhage or perforation: Secondary | ICD-10-CM | POA: Diagnosis not present

## 2021-10-14 DIAGNOSIS — E876 Hypokalemia: Secondary | ICD-10-CM | POA: Diagnosis not present

## 2021-10-14 DIAGNOSIS — I5021 Acute systolic (congestive) heart failure: Secondary | ICD-10-CM | POA: Diagnosis not present

## 2021-10-14 LAB — POCT I-STAT EG7
Acid-Base Excess: 7 mmol/L — ABNORMAL HIGH (ref 0.0–2.0)
Acid-Base Excess: 7 mmol/L — ABNORMAL HIGH (ref 0.0–2.0)
Bicarbonate: 32 mmol/L — ABNORMAL HIGH (ref 20.0–28.0)
Bicarbonate: 32.8 mmol/L — ABNORMAL HIGH (ref 20.0–28.0)
Calcium, Ion: 1.14 mmol/L — ABNORMAL LOW (ref 1.15–1.40)
Calcium, Ion: 1.15 mmol/L (ref 1.15–1.40)
HCT: 41 % (ref 36.0–46.0)
HCT: 42 % (ref 36.0–46.0)
Hemoglobin: 13.9 g/dL (ref 12.0–15.0)
Hemoglobin: 14.3 g/dL (ref 12.0–15.0)
O2 Saturation: 76 %
O2 Saturation: 82 %
Potassium: 3.1 mmol/L — ABNORMAL LOW (ref 3.5–5.1)
Potassium: 3.1 mmol/L — ABNORMAL LOW (ref 3.5–5.1)
Sodium: 142 mmol/L (ref 135–145)
Sodium: 142 mmol/L (ref 135–145)
TCO2: 33 mmol/L — ABNORMAL HIGH (ref 22–32)
TCO2: 34 mmol/L — ABNORMAL HIGH (ref 22–32)
pCO2, Ven: 46.2 mmHg (ref 44–60)
pCO2, Ven: 47.6 mmHg (ref 44–60)
pH, Ven: 7.446 — ABNORMAL HIGH (ref 7.25–7.43)
pH, Ven: 7.449 — ABNORMAL HIGH (ref 7.25–7.43)
pO2, Ven: 39 mmHg (ref 32–45)
pO2, Ven: 45 mmHg (ref 32–45)

## 2021-10-14 LAB — POCT I-STAT 7, (LYTES, BLD GAS, ICA,H+H)
Acid-Base Excess: 7 mmol/L — ABNORMAL HIGH (ref 0.0–2.0)
Bicarbonate: 31.3 mmol/L — ABNORMAL HIGH (ref 20.0–28.0)
Calcium, Ion: 1.18 mmol/L (ref 1.15–1.40)
HCT: 42 % (ref 36.0–46.0)
Hemoglobin: 14.3 g/dL (ref 12.0–15.0)
O2 Saturation: 95 %
Potassium: 3.1 mmol/L — ABNORMAL LOW (ref 3.5–5.1)
Sodium: 141 mmol/L (ref 135–145)
TCO2: 33 mmol/L — ABNORMAL HIGH (ref 22–32)
pCO2 arterial: 42 mmHg (ref 32–48)
pH, Arterial: 7.481 — ABNORMAL HIGH (ref 7.35–7.45)
pO2, Arterial: 73 mmHg — ABNORMAL LOW (ref 83–108)

## 2021-10-14 LAB — BASIC METABOLIC PANEL
Anion gap: 9 (ref 5–15)
BUN: 18 mg/dL (ref 6–20)
CO2: 24 mmol/L (ref 22–32)
Calcium: 9 mg/dL (ref 8.9–10.3)
Chloride: 105 mmol/L (ref 98–111)
Creatinine, Ser: 0.76 mg/dL (ref 0.44–1.00)
GFR, Estimated: >60 mL/min (ref >60)
Glucose, Bld: 100 mg/dL — ABNORMAL HIGH (ref 70–99)
Potassium: 3.7 mmol/L (ref 3.5–5.1)
Sodium: 138 mmol/L (ref 135–145)

## 2021-10-14 MED ORDER — IVABRADINE HCL 5 MG PO TABS
5.0000 mg | ORAL_TABLET | Freq: Two times a day (BID) | ORAL | 0 refills | Status: DC
  Filled 2021-10-14: qty 60, 30d supply, fill #0

## 2021-10-14 NOTE — Progress Notes (Signed)
Heart Failure Stewardship Pharmacist Progress Note ? ? ?PCP: No primary care provider on file. ?PCP-Cardiologist: None  ? ? ?HPI:  ?56 yo F with PMH of GI bleeding and GERD. She presented to the ED on 3/22 with shortness of breath, chest discomfort, orthopnea, and mild LE edema. CXR with cardiomegaly and pulmonary edema. CTA negative for PE. An ECHO was done on 3/23 and LVEF was 20-25% with G2DD. R/LHC from 3/24 with no CAD and good hemodynamics - RA 1, PA 15, wedge 7, CO 11, CI 6. Discharge delayed due to tachycardia. ? ?Current HF Medications: ?Diuretic: furosemide 20 mg PO daily ?Beta Blocker: metoprolol XL 50 mg daily ?Aldosterone Antagonist: spironolactone 12.5 mg daily ?SGLT2i: Farxiga 10 mg daily ?Other: ivabradine 5 mg BID ? ?Prior to admission HF Medications: ?None ? ?Pertinent Lab Values: ?Serum creatinine 0.76, BUN 18, Potassium 3.7, Sodium 138, BNP 437.3, A1c 5.6  ? ?Vital Signs: ?Weight: 179 lbs (admission weight: 193 lbs) ?Blood pressure: 90-100/60s  ?Heart rate: 70-80s ?I/O: -1.5L yesterday; net -4.8L ? ?Medication Assistance / Insurance Benefits Check: ?Does the patient have prescription insurance?  Yes ?Type of insurance plan: [employer] commercial insurance ? ?Outpatient Pharmacy:  ?[facility] admission [facility] ?Is the patient willing to use [facility] at discharge? Yes ?Is the patient willing to transition their outpatient pharmacy to utilize a [facility] outpatient pharmacy?   Pending ?  ? ?Assessment: ?1. 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 NYHA class II symptoms. ?- Continue furosemide 20 mg PO daily ?- Continue metoprolol XL 50 mg daily ?- Continue spironolactone 12.5 mg daily  ?- Continue Farxiga 10 mg daily   ?- Continue ivabradine 5 mg BID ? ?Plan: ?1) Medication changes recommended at this time: ?- Continue current regimen ? ?2) Patient assistance: ?- Farxiga copay $15 - monthly copay card lowers to $0 per month. CM asked to bring her a copay card  today. ?- Entresto copay $100 - monthly copay card lowers to $10 per fill ?- Ivabradine requires PA - completing today ? ?Kerby Nora, PharmD, BCPS ?Heart Failure Stewardship Pharmacist ?Phone [phone] ? ? ?

## 2021-10-14 NOTE — Progress Notes (Addendum)
Heart Failure Patient Advocate Encounter ?  ?Received notification from [employer] that prior authorization for [place] is required. ?  ?PA submitted on CoverMyMeds ?Key [id number] ?Status is approved through 10/15/22 ? ?Copay $20  ?  ? ?Sharen Hones, PharmD, BCPS ?Heart Failure Stewardship Pharmacist ?Phone [phone] ? ?Please check [email or web address] for unit-specific pharmacist phone numbers ? ?

## 2021-10-14 NOTE — Plan of Care (Signed)

## 2021-10-14 NOTE — Progress Notes (Signed)
Patient is feeling better, heart rate has improved, no dizziness or lightheadedness.  ? ?BP 101/65   Pulse 81   Temp 97.8 ?F (36.6 ?C) (Oral)   Resp 19   Ht 5\' 4"  (1.626 m)   Wt 81.6 kg   LMP 09/05/2021 (Approximate)   SpO2 94%   BMI 30.88 kg/m?  ? ?Neurology awake and alert ?ENT with no pallor ?Cardiovascular with S1 and S2 present and rhythmic with no gallops, rubs or murmurs ?No JVD ?No lower extremity edema ?Respiratory with no rales or wheezing ?Abdomen not distended ? ?Acute heart failure decompensation ?Improved heart rate, plan to discharge home today.  ?

## 2021-10-15 ENCOUNTER — Other Ambulatory Visit: Payer: Self-pay

## 2021-10-15 ENCOUNTER — Ambulatory Visit (INDEPENDENT_AMBULATORY_CARE_PROVIDER_SITE_OTHER): Payer: 59 | Admitting: Family Medicine

## 2021-10-15 ENCOUNTER — Encounter: Payer: Self-pay | Admitting: Family Medicine

## 2021-10-15 VITALS — BP 133/73 | HR 88 | Temp 98.0°F | Resp 16 | Ht 63.0 in | Wt 181.8 lb

## 2021-10-15 DIAGNOSIS — E876 Hypokalemia: Secondary | ICD-10-CM | POA: Diagnosis not present

## 2021-10-15 DIAGNOSIS — Z1231 Encounter for screening mammogram for malignant neoplasm of breast: Secondary | ICD-10-CM

## 2021-10-15 DIAGNOSIS — I5022 Chronic systolic (congestive) heart failure: Secondary | ICD-10-CM

## 2021-10-15 DIAGNOSIS — Z789 Other specified health status: Secondary | ICD-10-CM | POA: Diagnosis not present

## 2021-10-15 DIAGNOSIS — Z7689 Persons encountering health services in other specified circumstances: Secondary | ICD-10-CM

## 2021-10-15 DIAGNOSIS — Z09 Encounter for follow-up examination after completed treatment for conditions other than malignant neoplasm: Secondary | ICD-10-CM | POA: Diagnosis not present

## 2021-10-15 NOTE — Progress Notes (Signed)
? ?New Patient Office Visit ? ?Subjective:  ?Patient ID: Lori Mcbride, female    DOB: [birth year]  Age: 56 y.o. MRN: [id number] ? ?CC:  ?Chief Complaint  ?Patient presents with  ? Establish Care  ? Follow-up  ?  HFU  ? ? ?HPI ?Lori Mcbride presents for to establish care. Patient reports that she has been recently discharged from hospital where she had been admitted for CHF. She reports that she has been improving daily.  ? ?Past Medical History:  ?Diagnosis Date  ? GI bleeding 12/2019  ? Knee pain, right   ? Refusal of blood product   ? Refusal of blood transfusions as patient is Jehovah's Witness   ? ? ?Past Surgical History:  ?Procedure Laterality Date  ? APPENDECTOMY    ? ESOPHAGOGASTRODUODENOSCOPY N/A 01/11/2020  ? Procedure: ESOPHAGOGASTRODUODENOSCOPY (EGD);  Surgeon: Graylin Shiver, MD;  Location: [facility] ENDOSCOPY;  Service: Endoscopy;  Laterality: N/A;  ? HEMOSTASIS CLIP PLACEMENT  01/11/2020  ? Procedure: HEMOSTASIS CLIP PLACEMENT;  Surgeon: Graylin Shiver, MD;  Location[facility] ENDOSCOPY;  Service: Endoscopy;;  ? HEMOSTASIS CONTROL  01/11/2020  ? Procedure: HEMOSTASIS CONTROL;  Surgeon: Graylin Shiver, MD;  Location[facility] ENDOSCOPY;  Service: Endoscopy;;  epinephrine injection  ? RIGHT/LEFT HEART CATH AND CORONARY ANGIOGRAPHY N/A 10/11/2021  ? Procedure: RIGHT/LEFT HEART CATH AND CORONARY ANGIOGRAPHY;  Surgeon: Orbie Pyo, MD;  Location: [facility] INVASIVE CV LAB;  Service: Cardiovascular;  Laterality: N/A;  ? ? ?Family History  ?Problem Relation Age of Onset  ? Hypertension Father   ? ? ?Social History  ? ?Socioeconomic History  ? Marital status: Married  ?  Spouse name: Alecia Lemming  ? Number of children: 3  ? Years of education: Not on file  ? Highest education level: High school graduate  ?Occupational History  ? 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  ?Tobacco Use  ? Smoking status: Never  ? Smokeless tobacco: Never  ?Vaping Use  ? Vaping Use: Never used  ?Substance and Sexual Activity  ?  Alcohol use: Never  ? Drug use: Never  ? Sexual activity: Yes  ?Other Topics Concern  ? Not on file  ?Social History Narrative  ? Not on file  ? ?Social Determinants of Health  ? ?Financial Resource Strain: Low Risk   ? Difficulty of Paying Living Expenses: Not very hard  ?Food Insecurity: Food Insecurity Present  ? Worried About Programme researcher, broadcasting/film/video in the Last Year: Sometimes true  ? Ran Out of Food in the Last Year: Never true  ?Transportation Needs: No Transportation Needs  ? Lack of Transportation (Medical): No  ? Lack of Transportation (Non-Medical): No  ?Physical Activity: Not on file  ?Stress: Not on file  ?Social Connections: Not on file  ?Intimate Partner Violence: Not on file  ? ? ?ROS ?Review of Systems  ?All other systems reviewed and are negative. ? ?Objective:  ? ?Today's Vitals: BP 133/73   Pulse 88   Temp 98 ?F (36.7 ?C) (Oral)   Resp 16   Ht 5\' 3"  (1.6 m)   Wt 181 lb 12.8 oz (82.5 kg)   SpO2 95%   BMI 32.20 kg/m?  ? ?Physical Exam ?Vitals and nursing note reviewed.  ?Constitutional:   ?   General: She is not in acute distress. ?Cardiovascular:  ?   Rate and Rhythm: Normal rate and regular rhythm.  ?Pulmonary:  ?   Effort: Pulmonary effort is normal.  ?  Breath sounds: Normal breath sounds.  ?Abdominal:  ?   Palpations: Abdomen is soft.  ?   Tenderness: There is no abdominal tenderness.  ?Musculoskeletal:  ?   Right lower leg: No edema.  ?   Left lower leg: No edema.  ?Neurological:  ?   General: No focal deficit present.  ?   Mental Status: She is alert and oriented to person, place, and time.  ? ? ?Assessment & Plan:  ? ?1. Chronic systolic congestive heart failure ([place]) ?Doing well since d/c. Management per consultant ? ?2. Hypokalemia ?resolved ? ?3. Language barrier to communication ? ? ?4. Encounter for screening mammogram for malignant neoplasm of breast ?Referred for mammogram ?- MM Digital Screening; Future ? ?5. Hospital discharge follow-up ?Patient has follow ups scheduled with  consultants  ? ?6. Encounter to establish care ? ? ? ? ?Outpatient Encounter Medications as of 10/15/2021  ?Medication Sig  ? dapagliflozin propanediol (FARXIGA) 10 MG TABS tablet Take 1 tablet (10 mg total) by mouth daily.  ? fluticasone (VERAMYST) 27.5 MCG/SPRAY nasal spray Place 2 sprays into the nose daily.  ? furosemide (LASIX) 20 MG tablet Take 1 tablet (20 mg total) by mouth daily.  ? ivabradine (CORLANOR) 5 MG TABS tablet Take 1 tablet (5 mg total) by mouth 2 (two) times daily with a meal.  ? metoprolol succinate (TOPROL-XL) 50 MG 24 hr tablet Take 1 tablet (50 mg total) by mouth daily. Take with or immediately following a meal.  ? pantoprazole (PROTONIX) 20 MG tablet Take 1 tablet (20 mg total) by mouth daily.  ? spironolactone (ALDACTONE) 25 MG tablet Take 0.5 tablets (12.5 mg total) by mouth daily.  ? benzonatate (TESSALON) 100 MG capsule Take by mouth 3 (three) times daily as needed for cough. (Patient not taking: Reported on 10/15/2021)  ? potassium chloride (KLOR-CON M) 10 MEQ tablet Take 1 tablet (10 mEq total) by mouth daily. (Patient not taking: Reported on 10/15/2021)  ? promethazine-dextromethorphan (PROMETHAZINE-DM) 6.25-15 MG/5ML syrup Take 5 mLs by mouth 4 (four) times daily as needed for cough. (Patient not taking: Reported on 10/15/2021)  ? ?No facility-administered encounter medications on file as of 10/15/2021.  ? ? ?Follow-up: Return in about 4 weeks (around 11/12/2021) for physical.  ? ?Becky Sax, MD ? ?

## 2021-10-15 NOTE — Progress Notes (Signed)
Patient is here for new est care and HFU. Patient said that she is feeling much better since getting home from hospital ?

## 2021-10-21 NOTE — Progress Notes (Addendum)
? ? ?HEART & VASCULAR TRANSITION OF CARE CONSULT NOTE  ? ? ? ?Referring Physician: Dr. Cathlean Sauer ?Primary Care: Dr. Redmond Pulling ?Primary Cardiologist: Dr. Marlou Porch ?HF Cardiologist: Establishing ? ?HPI: ?Referred to clinic by Dr. Cathlean Sauer with [facility] for heart failure consultation. 56 y.o. female with history of GERD, GI bleed d/t gastric ulcer 06/21, obesity, LBBB noted on prior ECGs. She is a Restaurant manager, fast food.  ? ?Admitted [id number] with new systolic CHF. Sinus tachycardia noted with rates in 120s, apparently had been tachycardic for some time.  Echo with EF 20-25%, RV okay, mild MR, LAE, moderate left pleural effusion. Diuresed with IV lasix. R/LHC with no significant CAD, CO 11/CI 6, RA 1, PCWP 7 mmHg, LVEDP 11.  She was initiated on GDMT with Toprol XL 25 mg daily, spiro 12.5 mg daily, Farxiga 10 mg daily, ivabradine 5 mg BID, furosemide 20 mg daily.  ? ?She has been feeling much better since hospital discharge. Energy level has improved. No dyspnea, orthopnea, PND or leg edema. Husband reports episodes of snoring and witnessed apneic episodes. Has been taking all medications as prescribed. Home blood pressures have been averaging 0000000 systolic. Home weight stable at 178 lb. Watching fluid and salt intake. ? ?Denies any family hx of CHF.  No alcohol or drug use. Denies any known viral illness prior to admission.  ? ?Has her own business cleaning homes. Taking several weeks off work to recover then will go back to work with reduced hours.  ? ?Review of Systems: [y] = yes, [ ]  = no  ? ?General: Weight gain [ ] ; Weight loss [ ] ; Anorexia [ ] ; Fatigue [ ] ; Fever [ ] ; Chills [ ] ; Weakness [ ]   ?Cardiac: Chest pain/pressure [ ] ; Resting SOB [ ] ; Exertional SOB [ ] ; Orthopnea [ ] ; Pedal Edema [ ] ; Palpitations [ ] ; Syncope [ ] ; Presyncope [ ] ; Paroxysmal nocturnal dyspnea[ ]   ?Pulmonary: Cough [ ] ; Wheezing[ ] ; Hemoptysis[ ] ; Sputum [ ] ; Snoring [ ]   ?GI: Vomiting[ ] ; Dysphagia[ ] ; Melena[ ] ; Hematochezia [ ] ;  Heartburn[ ] ; Abdominal pain [ ] ; Constipation [ ] ; Diarrhea [ ] ; BRBPR [ ]   ?GU: Hematuria[ ] ; Dysuria [ ] ; Nocturia[ ]   ?Vascular: Pain in legs with walking [ ] ; Pain in feet with lying flat [ ] ; Non-healing sores [ ] ; Stroke [ ] ; TIA [ ] ; Slurred speech [ ] ;  ?Neuro: Headaches[ ] ; Vertigo[ ] ; Seizures[ ] ; Paresthesias[ ] ;Blurred vision [ ] ; Diplopia [ ] ; Vision changes [ ]   ?Ortho/Skin: Arthritis [ ] ; Joint pain [ ] ; Muscle pain [ ] ; Joint swelling [ ] ; Back Pain [ ] ; Rash [ ]   ?Psych: Depression[ ] ; Anxiety[ ]   ?Heme: Bleeding problems [Y]; Clotting disorders [ ] ; Anemia [Y]  ?Endocrine: Diabetes [ ] ; Thyroid dysfunction[ ]  ? ? ?Past Medical History:  ?Diagnosis Date  ? GI bleeding 12/2019  ? Knee pain, right   ? Refusal of blood product   ? Refusal of blood transfusions as patient is Jehovah's Witness   ? ? ?Current Outpatient Medications  ?Medication Sig Dispense Refill  ? dapagliflozin propanediol (FARXIGA) 10 MG TABS tablet Take 1 tablet (10 mg total) by mouth daily. 30 tablet 0  ? fluticasone (VERAMYST) 27.5 MCG/SPRAY nasal spray Place 2 sprays into the nose daily.    ? furosemide (LASIX) 20 MG tablet Take 20 mg by mouth daily.    ? ivabradine (CORLANOR) 5 MG TABS tablet Take 1 tablet (5 mg total) by mouth 2 (two) times daily with a  meal. 60 tablet 0  ? metoprolol succinate (TOPROL-XL) 25 MG 24 hr tablet Take 25 mg by mouth daily.    ? potassium chloride (KLOR-CON M) 10 MEQ tablet Take 1 tablet (10 mEq total) by mouth daily. 30 tablet 0  ? spironolactone (ALDACTONE) 25 MG tablet Take 0.5 tablets (12.5 mg total) by mouth daily. 15 tablet 0  ? pantoprazole (PROTONIX) 20 MG tablet Take 1 tablet (20 mg total) by mouth daily. 30 tablet 0  ? ?No current facility-administered medications for this encounter.  ? ? ?No Known Allergies ? ?  ?Social History  ? ?Socioeconomic History  ? Marital status: Married  ?  Spouse name: [name]  ? Number of children: 3  ? Years of education: Not on file  ? Highest education level:  High school graduate  ?Occupational History  ? 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  ?Tobacco Use  ? Smoking status: Never  ? Smokeless tobacco: Never  ?Vaping Use  ? Vaping Use: Never used  ?Substance and Sexual Activity  ? Alcohol use: Never  ? Drug use: Never  ? Sexual activity: Yes  ?Other Topics Concern  ? Not on file  ?Social History Narrative  ? Not on file  ? ?Social Determinants of Health  ? ?Financial Resource Strain: Low Risk   ? Difficulty of Paying Living Expenses: Not very hard  ?Food Insecurity: Food Insecurity Present  ? Worried About Charity fundraiser in the Last Year: Sometimes true  ? Ran Out of Food in the Last Year: Never true  ?Transportation Needs: No Transportation Needs  ? Lack of Transportation (Medical): No  ? Lack of Transportation (Non-Medical): No  ?Physical Activity: Not on file  ?Stress: Not on file  ?Social Connections: Not on file  ?Intimate Partner Violence: Not on file  ? ? ?  ?Family History  ?Problem Relation Age of Onset  ? Hypertension Father   ? ? ?Vitals:  ? 10/22/21 0912  ?BP: 108/76  ?Pulse: 75  ?SpO2: 97%  ?Weight: 82.1 kg  ? ? ?PHYSICAL EXAM: ?General:  No distress. Husband present. ?HEENT: normal ?Neck: supple. no JVD. Carotids 2+ bilat; no bruits. No lymphadenopathy or thryomegaly appreciated. ?Cor: PMI nondisplaced. Regular rate & rhythm. No rubs, gallops or murmurs. ?Lungs: clear ?Abdomen: soft, nontender, nondistended.  ?Extremities: no cyanosis, clubbing, rash, edema ?Neuro: alert & oriented x 3, cranial nerves grossly intact. moves all 4 extremities w/o difficulty. Affect pleasant. ? ?ECG: SR 72 bpm, LBBB, QRS 140 ms ? ? ?ASSESSMENT & PLAN: ?HFrEF/NICM: ?-New diagnosis during admit 03/23 ?-Echo 03/23: EF 20-25%, RV okay, moderately dilated left atrium, moderate left pleural effusions, mild MR ?-R/LHC 03/23: No significant CAD, CO 11, CI 6, RA 1, PCWP 7, LVEDP 11 ?-Etiology not certain. No CAD. ? LBBB cardiomyopathy. No family hx CHF or  alcohol/drug use. Sinus tachycardia noted for some time prior to admit. Will obtain cMRI to assess for other possible etiologies such as myocarditis. ?-NYHA II. Volume looks good on exam and by ReDS which was 32%. Continue 20 mg furosemide daily + 10 mEq K daily ?-Continue Metoprolol XL 25 mg daily ?-Continue Corlanor 5 mg BID (HR 72 today). Has COPAY card. ?-Continue Farxiga 10 mg daily ?-Continue spiro 12.5 mg daily ?-Add Losartan 25 mg daily. If tolerating okay at visit with Cards in 2 weeks, consider switching to [facility].  ?-BMET, CBC, BNP today, BMP again in 2 weeks ?-Home sleep study ordered ?-Will need repeat echo end of 06/23 if EF has  not improved at time of cMRI. If EF remains < or = 35% will need referral for ICD. Not sure if would be candidate for CRT with QRS 140 ms. ?-Has house cleaning business. Planning to stay out of work 2 weeks post-discharge. Recommended she does not lift more than 30 lb and limit cleaning projects to no more than 4 hrs initially. ? ?2. Suspected OSA: ?-Snoring and apneic episodes ?-Home sleep study as above ? ?3. LBBB: ?-Chronic, noted on prior ECGs ?-Potentially contributing to reduced EF ?-Management as above ? ? ? ?NYHA II ?GDMT  ?Diuretic-Furosemide 20 mg daily ?BB-Metoprolol xl 25 mg daily ?Ace/ARB/ARNI-Adding losartan 25 mg daily ?MRA-Spiro 12.5 mg daily ?SGLT2i-Farxiga 10 mg daily ? ? ? ?Referred to HFSW (PCP, Medications, Transportation, ETOH Abuse, Drug Abuse, Insurance, Financial ): No ?Refer to Pharmacy: No ?Refer to Home Health: No ?Refer to Advanced Heart Failure Clinic: Yes ?Refer to General Cardiology: No - has f/u 04/19 ? ?Follow up: F/u with general cardiology 04/19 as scheduled (consider switching Losartan to entresto if tolerating), Dr. Aundra Dubin in 6 weeks to establish care. ?

## 2021-10-22 ENCOUNTER — Encounter (HOSPITAL_COMMUNITY): Payer: Self-pay

## 2021-10-22 ENCOUNTER — Ambulatory Visit (HOSPITAL_COMMUNITY)
Admit: 2021-10-22 | Discharge: 2021-10-22 | Disposition: A | Discharge status: Home / Self Care | Payer: 59 | Attending: Physician Assistant | Admitting: Physician Assistant

## 2021-10-22 VITALS — BP 108/76 | HR 75 | Wt 181.0 lb

## 2021-10-22 DIAGNOSIS — Z8711 Personal history of peptic ulcer disease: Secondary | ICD-10-CM | POA: Diagnosis not present

## 2021-10-22 DIAGNOSIS — R000 Tachycardia, unspecified: Secondary | ICD-10-CM | POA: Diagnosis not present

## 2021-10-22 DIAGNOSIS — K219 Gastro-esophageal reflux disease without esophagitis: Secondary | ICD-10-CM | POA: Diagnosis not present

## 2021-10-22 DIAGNOSIS — I428 Other cardiomyopathies: Secondary | ICD-10-CM | POA: Insufficient documentation

## 2021-10-22 DIAGNOSIS — I447 Left bundle-branch block, unspecified: Secondary | ICD-10-CM | POA: Diagnosis not present

## 2021-10-22 DIAGNOSIS — E669 Obesity, unspecified: Secondary | ICD-10-CM | POA: Diagnosis not present

## 2021-10-22 DIAGNOSIS — Z79899 Other long term (current) drug therapy: Secondary | ICD-10-CM | POA: Insufficient documentation

## 2021-10-22 DIAGNOSIS — R0683 Snoring: Secondary | ICD-10-CM | POA: Insufficient documentation

## 2021-10-22 DIAGNOSIS — I5020 Unspecified systolic (congestive) heart failure: Secondary | ICD-10-CM | POA: Insufficient documentation

## 2021-10-22 DIAGNOSIS — R0681 Apnea, not elsewhere classified: Secondary | ICD-10-CM | POA: Diagnosis not present

## 2021-10-22 LAB — CBC
HCT: 42.8 % (ref 36.0–46.0)
Hemoglobin: 13.8 g/dL (ref 12.0–15.0)
MCH: 27.3 pg (ref 26.0–34.0)
MCHC: 32.2 g/dL (ref 30.0–36.0)
MCV: 84.8 fL (ref 80.0–100.0)
Platelets: 245 10*3/uL (ref 150–400)
RBC: 5.05 MIL/uL (ref 3.87–5.11)
RDW: 13.8 % (ref 11.5–15.5)
WBC: 5.5 10*3/uL (ref 4.0–10.5)
nRBC: 0 % (ref 0.0–0.2)

## 2021-10-22 LAB — BASIC METABOLIC PANEL
Anion gap: 8 (ref 5–15)
BUN: 15 mg/dL (ref 6–20)
CO2: 25 mmol/L (ref 22–32)
Calcium: 9.6 mg/dL (ref 8.9–10.3)
Chloride: 109 mmol/L (ref 98–111)
Creatinine, Ser: 0.86 mg/dL (ref 0.44–1.00)
GFR, Estimated: >60 mL/min (ref >60)
Glucose, Bld: 102 mg/dL — ABNORMAL HIGH (ref 70–99)
Potassium: 4.1 mmol/L (ref 3.5–5.1)
Sodium: 142 mmol/L (ref 135–145)

## 2021-10-22 LAB — BRAIN NATRIURETIC PEPTIDE: B Natriuretic Peptide: 135.1 pg/mL — ABNORMAL HIGH (ref 0.0–100.0)

## 2021-10-22 MED ORDER — LOSARTAN POTASSIUM 25 MG PO TABS: 25.0000 mg | ORAL_TABLET | Freq: Every day | ORAL | 3 refills | Status: DC

## 2021-10-22 MED ORDER — METOPROLOL SUCCINATE ER 25 MG PO TB24: 25.0000 mg | ORAL_TABLET | Freq: Every day | ORAL | 6 refills | Status: DC

## 2021-10-22 MED ORDER — DAPAGLIFLOZIN PROPANEDIOL 10 MG PO TABS: 10.0000 mg | ORAL_TABLET | Freq: Every day | ORAL | 6 refills | Status: AC

## 2021-10-22 MED ORDER — FUROSEMIDE 20 MG PO TABS: 20.0000 mg | ORAL_TABLET | Freq: Every day | ORAL | 6 refills | Status: DC

## 2021-10-22 MED ORDER — POTASSIUM CHLORIDE CRYS ER 10 MEQ PO TBCR: 10.0000 meq | EXTENDED_RELEASE_TABLET | Freq: Every day | ORAL | 6 refills | Status: DC

## 2021-10-22 MED ORDER — SPIRONOLACTONE 25 MG PO TABS: 12.5000 mg | ORAL_TABLET | Freq: Every day | ORAL | 3 refills | Status: DC

## 2021-10-22 MED ORDER — IVABRADINE HCL 5 MG PO TABS: 5.0000 mg | ORAL_TABLET | Freq: Two times a day (BID) | ORAL | 6 refills | Status: DC

## 2021-10-22 NOTE — Addendum Note (Signed)
Encounter addended by: Andrey Farmer, PA-C on: 10/22/2021 1:39 PM ? Actions taken: Clinical Note Signed, Result note filed

## 2021-10-22 NOTE — Patient Instructions (Signed)
Start Losartan 25 mg Daily ? ?Labs done today, we will call you for abnormal results ? ?Your provider has recommended that you have a home sleep study.  We have provided you with the equipment in our office today. DO NOT OPEN OR USE THE DEVICE UNTIL WE TELL YOU TO DO SO, AFTER WE GET AUTHORIZATION FROM YOUR INSURANCE COMPANY. Please download the app and follow the instructions. YOUR PIN NUMBER IS: [id number]. Once you have completed the test you just dispose of the equipment, the information is automatically uploaded to [place] via blue-tooth technology. If your test is positive for sleep apnea and you need a home CPAP machine you will be contacted by Dr Norris Cross office [facility]) to set this up. ? ?Your physician has requested that you have a cardiac MRI. Cardiac MRI uses a computer to create images of your heart as its beating, producing both still and moving pictures of your heart and major blood vessels. For further information please visit [email or web address]. Please follow the instruction sheet given to you today for more information. WE WILL CALL YOU TO SCHEDULE THIS AFTER IT IS APPROVED BY YOUR INSURANCE COMPANY ? ?Please follow up with our heart failure pharmacist in 2-3 weeks with lab work ? ?Thank you for allowing [place] to provider your heart failure care after your recent hospitalization. Please follow-up with our Advanced Heart Failure Clinic in 6-8 weeks. ? ?Do the following things EVERYDAY: ?Weigh yourself in the morning before breakfast. Write it down and keep it in a log. ?Take your medicines as prescribed ?Eat low salt foods--Limit salt (sodium) to 2000 mg per day.  ?Stay as active as you can everyday ?Limit all fluids for the day to less than 2 liters ? ?If you have any questions or concerns before your next appointment please send [place] a message through [place] or call our office at [phone].   ? ?TO LEAVE A MESSAGE FOR THE NURSE SELECT OPTION 2, PLEASE LEAVE A MESSAGE INCLUDING: ?YOUR NAME ?DATE OF  BIRTH ?CALL BACK NUMBER ?REASON FOR CALL**this is important as we prioritize the call backs ? ?YOU WILL RECEIVE A CALL BACK THE SAME DAY AS LONG AS YOU CALL BEFORE 4:00 PM ? ? ? ? ?

## 2021-10-22 NOTE — Progress Notes (Signed)
ReDS Vest / Clip - 10/22/21 1000   ? ?  ? ReDS Vest / Clip  ? Station Marker A   ? Ruler Value 28   ? ReDS Value Range Low volume   ? ReDS Actual Value 32   ? ?  ?  ? ?  ? ? ?

## 2021-10-22 NOTE — Addendum Note (Signed)
Encounter addended by: Andrey Farmer, PA-C on: 10/22/2021 1:47 PM ? Actions taken: Clinical Note Signed

## 2021-10-28 ENCOUNTER — Other Ambulatory Visit (HOSPITAL_COMMUNITY): Payer: Self-pay

## 2021-10-28 ENCOUNTER — Other Ambulatory Visit: Payer: Self-pay

## 2021-10-31 ENCOUNTER — Telehealth (HOSPITAL_COMMUNITY): Payer: Self-pay | Admitting: *Deleted

## 2021-10-31 NOTE — Telephone Encounter (Signed)
Request for cmri faxed to [employer]  ?

## 2021-11-01 ENCOUNTER — Telehealth (HOSPITAL_COMMUNITY): Payer: Self-pay | Admitting: *Deleted

## 2021-11-01 NOTE — Telephone Encounter (Signed)
Auth sleep study faxed to [employer]  ?

## 2021-11-04 ENCOUNTER — Telehealth (HOSPITAL_COMMUNITY): Payer: Self-pay | Admitting: Surgery

## 2021-11-04 ENCOUNTER — Telehealth (HOSPITAL_COMMUNITY): Payer: Self-pay | Admitting: *Deleted

## 2021-11-04 NOTE — Telephone Encounter (Signed)
I attempted to reach patient in reference to ordered home sleep study.  I left a message that it was okay to proceed with the study as insurance prior authorization is not needed. ?

## 2021-11-04 NOTE — Telephone Encounter (Signed)
No pre cert reqd for home sleep study  

## 2021-11-06 ENCOUNTER — Encounter: Payer: Self-pay | Admitting: Physician Assistant

## 2021-11-06 ENCOUNTER — Ambulatory Visit: Payer: 59 | Admitting: Physician Assistant

## 2021-11-06 VITALS — BP 116/66 | HR 80 | Ht 63.0 in | Wt 181.6 lb

## 2021-11-06 DIAGNOSIS — I428 Other cardiomyopathies: Secondary | ICD-10-CM

## 2021-11-06 DIAGNOSIS — I447 Left bundle-branch block, unspecified: Secondary | ICD-10-CM

## 2021-11-06 DIAGNOSIS — I5022 Chronic systolic (congestive) heart failure: Secondary | ICD-10-CM

## 2021-11-06 NOTE — Patient Instructions (Signed)
Medication Instructions:  ?Your physician recommends that you continue on your current medications as directed. Please refer to the Current Medication list given to you today. ?*If you need a refill on your cardiac medications before your next appointment, please call your pharmacy* ? ? ?Lab Work: ?None Ordered ? ? ?Testing/Procedures: ?None Ordered ? ? ?Follow-Up: ?At [facility], you and your health needs are our priority.  As part of our continuing mission to provide you with exceptional heart care, we have created designated Provider Care Teams.  These Care Teams include your primary Cardiologist (physician) and Advanced Practice Providers (APPs -  Physician Assistants and Nurse Practitioners) who all work together to provide you with the care you need, when you need it. ? ?We recommend signing up for the patient portal called "MyChart".  Sign up information is provided on this After Visit Summary.  MyChart is used to connect with patients for Virtual Visits (Telemedicine).  Patients are able to view lab/test results, encounter notes, upcoming appointments, etc.  Non-urgent messages can be sent to your provider as well.   ?To learn more about what you can do with MyChart, go to [email or web address].   ? ?Your next appointment:   ?6 month(s) ? ?The format for your next appointment:   ?In Person ? ?Provider:   ?Mark Skains, MD   ? ? ?Other Instructions ? ? ?Important Information About Sugar ? ? ? ? ?  ?

## 2021-11-06 NOTE — Progress Notes (Signed)
?Cardiology Office Note:   ? ?Date:  11/06/2021  ? ?ID:  [place], [place] 1965/08/03, MRN [id number] ? ?PCP:  Dorna Mai, MD  ?[facility] Cardiologist:  Candee Furbish, MD  ?[facility] Electrophysiologist:  None  ? ?Chief Complaint: hospital follow up ? ?History of Present Illness:   ? ?Lori Mcbride is a 56 y.o. female with a hx of acid reflux and GI bleed presents for hospital follow-up. She is a Restaurant manager, fast food. ?  ?Admitted March 2023 for CHF.  CT angio negative for PE with bilateral pleural effusions and pulmonary edema.  Echocardiogram 3/22 showed LVEF of 20 to 25% with global hypokinesis, grade 2 diastolic dysfunction, normal RV, moderately dilated left atrial size, trivial pericardial effusion. Found to have LBBB. Cardiac catheterization showed no evidence of coronary disease.  Excellent cardiac output.  LVEDP was 11.  Well compensated. Placed on goal-directed medical therapy, beta-blocker, spironolactone, SGLT2 inhibitor as tolerated. In 3 months, repeat echocardiogram.  If EF still low, EP referral for CRT-D. ? ?Seen in Heart Failure TOC clinic 10/22/21. Pending cMRI and sleep study.  Added losartan with plan to switch to [facility].  ? ?Here today for for follow up with husband.  She walks 30 minutes each day without any issue.  Energy has improved significantly.  Compliant with medications and low-sodium diet. The patient denies nausea, vomiting, fever, chest pain, palpitations, shortness of breath, orthopnea, PND, dizziness, syncope, cough, congestion, abdominal pain, hematochezia, melena, lower extremity edema.  ? ? ? ?Past Medical History:  ?Diagnosis Date  ? GI bleeding 12/2019  ? Knee pain, right   ? Refusal of blood product   ? Refusal of blood transfusions as patient is Jehovah's Witness   ? ? ?Past Surgical History:  ?Procedure Laterality Date  ? APPENDECTOMY    ? ESOPHAGOGASTRODUODENOSCOPY N/A 01/11/2020  ? Procedure: ESOPHAGOGASTRODUODENOSCOPY (EGD);  Surgeon: Wonda Horner, MD;  Location[facility] END[facility]COPY;  Service: Endoscopy;  Laterality: N/A;  ? HEMOSTASIS CLIP PLACEMENT  01/11/2020  ? Procedure: HEMOSTASIS CLIP PLACEMENT;  Surgeon: Wonda Horner, MD;  Location[facility] ENDOSCOPY;  Service: Endoscopy;;  ? HEMOSTASIS CONTROL  01/11/2020  ? Procedure: HEMOSTASIS CONTROL;  Surgeon: Wonda Horner, MD;  Location: [facility] ENDOSCOPY;  Service: Endoscopy;;  epinephrine injection  ? RIGHT/LEFT HEART CATH AND CORONARY ANGIOGRAPHY N/A 10/11/2021  ? Procedure: RIGHT/LEFT HEART CATH AND CORONARY ANGIOGRAPHY;  Surgeon: Early Osmond, MD;  Location: [place] CV LAB;  Service: Cardiovascular;  Laterality: N/A;  ? ? ?Current Medications: ?Current Meds  ?Medication Sig  ? dapagliflozin propanediol (FARXIGA) 10 MG TABS tablet Take 1 tablet (10 mg total) by mouth daily.  ? fluticasone (VERAMYST) 27.5 MCG/SPRAY nasal spray Place 2 sprays into the nose daily.  ? furosemide (LASIX) 20 MG tablet Take 1 tablet (20 mg total) by mouth daily.  ? ivabradine (CORLANOR) 5 MG TABS tablet Take 1 tablet (5 mg total) by mouth 2 (two) times daily with a meal.  ? losartan (COZAAR) 25 MG tablet Take 1 tablet (25 mg total) by mouth daily.  ? metoprolol succinate (TOPROL-XL) 50 MG 24 hr tablet Take 50 mg by mouth daily. Take with or immediately following a meal.  ? potassium chloride (KLOR-CON M) 10 MEQ tablet Take 1 tablet (10 mEq total) by mouth daily.  ? spironolactone (ALDACTONE) 25 MG tablet Take 0.5 tablets (12.5 mg total) by mouth daily.  ?  ? ?Allergies:   Patient has no known allergies.  ? ?Social  History  ? ?Socioeconomic History  ? Marital status: Married  ?  Spouse name: Lori Mcbride  ? Number of children: 3  ? Years of education: Not on file  ? Highest education level: High school graduate  ?Occupational History  ? 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  ?Tobacco Use  ? Smoking status: Never  ? Smokeless tobacco: Never  ?Vaping Use  ? Vaping Use: Never used  ?Substance and Sexual Activity  ? Alcohol use:  Never  ? Drug use: Never  ? Sexual activity: Yes  ?Other Topics Concern  ? Not on file  ?Social History Narrative  ? Not on file  ? ?Social Determinants of Health  ? ?Financial Resource Strain: Low Risk   ? Difficulty of Paying Living Expenses: Not very hard  ?Food Insecurity: Food Insecurity Present  ? Worried About Charity fundraiser in the Last Year: Sometimes true  ? Ran Out of Food in the Last Year: Never true  ?Transportation Needs: No Transportation Needs  ? Lack of Transportation (Medical): No  ? Lack of Transportation (Non-Medical): No  ?Physical Activity: Not on file  ?Stress: Not on file  ?Social Connections: Not on file  ?  ? ?Family History: ?The patient's family history includes Hypertension in her father.   ? ?ROS:   ?Please see the history of present illness.    ?All other systems reviewed and are negative.  ? ?EKGs/Labs/Other Studies Reviewed:   ? ?The following studies were reviewed today: ? ?Echo 10/10/21 ? 1. Left ventricular ejection fraction, by estimation, is 20 to 25%. The  ?left ventricle has severely decreased function. The left ventricle  ?demonstrates global hypokinesis. The left ventricular internal cavity size  ?was severely dilated. Left ventricular  ?diastolic parameters are consistent with Grade II diastolic dysfunction  ?(pseudonormalization). Elevated left ventricular end-diastolic pressure.  ? 2. Right ventricular systolic function is normal. The right ventricular  ?size is normal. There is normal pulmonary artery systolic pressure.  ? 3. Left atrial size was moderately dilated.  ? 4. Moderate pleural effusion in the left lateral region.  ? 5. The mitral valve is normal in structure. Mild mitral valve  ?regurgitation. No evidence of mitral stenosis.  ? 6. The aortic valve is tricuspid. Aortic valve regurgitation is not  ?visualized. No aortic stenosis is present.  ? 7. The inferior vena cava is normal in size with greater than 50%  ?respiratory variability, suggesting right atrial  pressure of 3 mmHg.  ? ?RIGHT/LEFT HEART CATH AND CORONARY ANGIOGRAPHY 10/11/21  ? ?Conclusion ? ?1.  Normal right dominant circulation ?2.  Cardiac output of 11 L/min and an index of 6 L/min/m? with mean RA pressure of 1 mmHg and mean wedge pressure of 7 mmHg; LVEDP was 11 mmHg. ?3.  Ejection fraction of around 30% on ventriculography. ?  ?Recommendation: Goal-directed medical therapy. ? ?EKG:  EKG is  not ordered today.   ?Recent Labs: ?01/29/2021: ALT 26 ?10/09/2021: TSH 1.036 ?10/12/2021: Magnesium 2.3 ?10/22/2021: B Natriuretic Peptide 135.1; BUN 15; Creatinine, Ser 0.86; Hemoglobin 13.8; Platelets 245; Potassium 4.1; Sodium 142  ?Recent Lipid Panel ?   ?Component Value Date/Time  ? CHOL 137 10/11/2021 0037  ? TRIG 64 10/11/2021 0037  ? HDL 41 10/11/2021 0037  ? CHOLHDL 3.3 10/11/2021 0037  ? VLDL 13 10/11/2021 0037  ? [place] 83 10/11/2021 0037  ? ?Physical Exam:   ? ?VS:  BP 116/66 (BP Location: Right Arm, Patient Position: Sitting, Cuff Size: Normal)   Pulse 80  Ht 5\' 3"  (1.6 m)   Wt 181 lb 9.6 oz (82.4 kg)   SpO2 96%   BMI 32.17 kg/m?    ? ?Wt Readings from Last 3 Encounters:  ?11/06/21 181 lb 9.6 oz (82.4 kg)  ?10/22/21 181 lb (82.1 kg)  ?10/15/21 181 lb 12.8 oz (82.5 kg)  ?  ? ?GEN:  Well nourished, well developed in no acute distress ?HEENT: Normal ?NECK: No JVD; No carotid bruits ?LYMPHATICS: No lymphadenopathy ?CARDIAC: RRR, no murmurs, rubs, gallops ?RESPIRATORY:  Clear to auscultation without rales, wheezing or rhonchi  ?ABDOMEN: Soft, non-tender, non-distended ?MUSCULOSKELETAL:  No edema; No deformity  ?SKIN: Warm and dry ?NEUROLOGIC:  Alert and oriented x 3 ?PSYCHIATRIC:  Normal affect  ? ?ASSESSMENT AND PLAN:  ? ? ?Chronic systolic CHF/ NICM ?- No CAD by cath. Has pending cMRI.  No evidence of volume overload.  Continue current medical therapy.  This is managed by heart failure TOC clinic.  Has follow-up next week with plan to transition Entresto. ? ?2. Suspected OSA ?- Has pending sleep study   ? ?3. LBBB ?- Chronic  ? ?Medication Adjustments/Labs and Tests Ordered: ?Current medicines are reviewed at length with the patient today.  Concerns regarding medicines are outlined above.  ?No orders of the

## 2021-11-10 ENCOUNTER — Encounter (INDEPENDENT_AMBULATORY_CARE_PROVIDER_SITE_OTHER): Payer: 59 | Admitting: Cardiology

## 2021-11-10 ENCOUNTER — Other Ambulatory Visit (HOSPITAL_COMMUNITY): Payer: Self-pay | Admitting: Physician Assistant

## 2021-11-10 DIAGNOSIS — G4733 Obstructive sleep apnea (adult) (pediatric): Secondary | ICD-10-CM | POA: Diagnosis not present

## 2021-11-10 NOTE — Progress Notes (Incomplete)
***In Progress*** ? ?  ?Advanced Heart Failure Clinic Note  ? ?Referring Physician: Dr. Cathlean Sauer ?Primary Care: Dr. Redmond Pulling ?Primary Cardiologist: Dr. Marlou Porch ?HF Cardiologist: Dr. Aundra Dubin ?  ?HPI: ?Referred to clinic by Dr. Cathlean Sauer with [facility] for heart failure consultation. 56 y.o. female with history of GERD, GI bleed d/t gastric ulcer 12/2019, obesity, LBBB noted on prior ECGs. She is a Restaurant manager, fast food.  ?  ?Admitted 99991111 with new systolic CHF. Sinus tachycardia noted with rates in 120s, apparently had been tachycardic for some time.  Echo with EF 20-25%, RV okay, mild MR, LAE, moderate left pleural effusion. Diuresed with IV lasix. R/LHC with no significant CAD, CO 11/CI 6, RA 1, PCWP 7 mmHg, LVEDP 11.  She was initiated on GDMT with Toprol XL 25 mg daily, spiro 12.5 mg daily, Farxiga 10 mg daily, ivabradine 5 mg BID, furosemide 20 mg daily.  ?  ?She presented to clinic on 10/22/2021 and been feeling much better with improved energy levels since hospital discharge. Denied any family hx of CHF.  No alcohol or drug use. Denied any known viral illness prior to admission.  Denied dyspnea, orthopnea, PND or leg edema. Her husband reported episodes of snoring and witnessed apneic episodes. She had been taking all medications as prescribed. Her home blood pressures had been averaging 0000000 systolic and her home weight was stable at 178 lb. She was watching fluid and salt intake.She has her own business cleaning homes. She was taking several weeks off work to recover then will go back to work with reduced hours.  ? ?Today she returns to HF clinic for pharmacist medication titration. At last visit with NP losartan 25 mg daily was initiated.  ? ?She subsequently had follow-up with general cardiology on 11/06/2021 and was doing well however no medication changes were made.  ? ?Overall feeling ***. ?Dizziness, lightheadedness, fatigue:  ?Chest pain or palpitations: ? ?How is your breathing?: *** ?SOB: ?Able  to complete all ADLs. Activity level *** ? ?Weight at home pounds. Takes furosemide 20 mg daily. *** ?LEE ?PND/Orthopnea ? ?Appetite *** ?Low-salt diet:  ? ?Physical Exam ?Cost/affordability of meds  ? ?HF Medications: ?Metoprolol succinate 25 mg daily ?Losartan 25 mg daily ?Spironolactone 12.5 mg daily ?Farxiga 10 mg daily ?Ivabridine 5 mg BID ?Furosemide 20 mg daily ?Potassium chloride 10 mEq daily ? ? ?Has the patient been experiencing any side effects to the medications prescribed?  {YES NO:22349} ? ?Does the patient have any problems obtaining medications due to transportation or finances?    ?[employer] health plan ?Ivabridine - copay card ? ?Understanding of regimen: {excellent/good/fair/poor:19665} ?Understanding of indications: {excellent/good/fair/poor:19665} ?Potential of compliance: {excellent/good/fair/poor:19665} ?Patient understands to avoid NSAIDs. ?Patient understands to avoid decongestants. ?  ?Pertinent Lab Values: ?Labs 10/22/2021: Serum creatinine 0.86, BUN 15, Potassium 4.1, Sodium 142, BNP 1135 ?Labs 4/37/2023: pending ? ?Vital Signs: ?Weight: *** (last clinic weight: 181 lbs) ?Blood pressure: *** 108/76 (116/66 at gen cards) ?Heart rate: *** 75, 80 ? ?Plan ?-bmet today s/p losartan start ?A. Losartan 25> entresto 24/26 bid - 2 bmet 2 weeks (copay card), potnetial pharm apt then to make further changes before 5/22 apt ?B. Spironolactone to 25 mg daily, stop Kcl ?C. Metoprolol succinate to 50 mg daily, stop ivabrdine 5 bid, concerned if think she is using HR to compensate, but volume status stable cath CI 6 ? ?Assessment/Plan: ?HFrEF/NICM: ?-New diagnosis during admit 09/2021 ?-Echo 09/2021: EF 20-25%, RV okay, moderately dilated left atrium, moderate left pleural effusions, mild MR ?-R/LHC 09/2021:  No significant CAD, CO 11, CI 6, RA 1, PCWP 7, LVEDP 11 ?-Etiology not certain. No CAD. ? LBBB cardiomyopathy. No family hx CHF or alcohol/drug use. Sinus tachycardia noted for some time prior to admit.  cMRI to assess for other possible etiologies such as myocarditis pending. ?-NYHA II. Volume looks good on exam and by ReDS which was 32%. *** ?-Continue 20 mg furosemide daily + 10 mEq K daily ?-Continue Metoprolol XL 25 mg daily ?-Continue Corlanor 5 mg BID (HR 72 today). Has COPAY card. ?-Continue Farxiga 10 mg daily ?-Continue spiro 12.5 mg daily ?-Add Losartan 25 mg daily. If tolerating okay at visit with Cards in 2 weeks, consider switching to [employer]  ?-BMET, CBC, BNP today, BMP again in 2 weeks ?-Home sleep study ordered ?-Will need repeat echo end of 06/23 if EF has not improved at time of cMRI. If EF remains < or = 35% will need referral for ICD. Not sure if would be candidate for CRT with QRS 140 ms. ?-Has house cleaning business. Planning to stay out of work 2 weeks post-discharge. Recommended she does not lift more than 30 lb and limit cleaning projects to no more than 4 hrs initially. ?  ?2. Suspected OSA: ?-Snoring and apneic episodes ?-Home sleep study as above ?  ?3. LBBB: ?-Chronic, noted on prior ECGs ?-Potentially contributing to reduced EF ?-Management as above ? ?Follow up 12/09/2021 with Dr. Aundra Dubin ? ?Audry Riles, PharmD, BCPS, BCCP, CPP ?Heart Failure Clinic Pharmacist ?[phone] ?  ?

## 2021-11-11 ENCOUNTER — Telehealth: Payer: Self-pay | Admitting: *Deleted

## 2021-11-11 DIAGNOSIS — G4733 Obstructive sleep apnea (adult) (pediatric): Secondary | ICD-10-CM

## 2021-11-11 NOTE — Progress Notes (Deleted)
?  ? ? ?  SLEEP STUDY REPORT ?Patient Information ?Study Date: 11/10/21 ?Patient Name: Lori Mcbride ?Patient ID: [id number] ?Birth Date: 08/12/1965 ?Age: 56 ?Gender: Female ?Referring Physician:Mark Skains, MD ?  ?TEST DESCRIPTION: Home sleep apnea testing was completed using the WatchPat, a Type 1 device, utilizing ?peripheral arterial tonometry (PAT), chest movement, actigraphy, pulse oximetry, pulse rate, body position and snore. ?AHI was calculated with apnea and hypopnea using valid sleep time as the denominator. RDI includes apneas, ?hypopneas, and RERAs. The data acquired and the scoring of sleep and all associated events were performed in ?accordance with the recommended standards and specifications as outlined in the AASM Manual for the Scoring of ?Sleep and Associated Events 2.2.0 (2015). ?  ?FINDINGS: ?1. Severe Obstructive Sleep Apnea with AHI 45.5/hr. ?2. Mild Central Sleep Apnea with pAHIc 15.3/hr with 33% [place] Respirations. ?3. Oxygen desaturations as low as 62%. ?4. Severe snoring was present. O2 sats were < 88% for 2 min. ?5. Total sleep time was 9 hrs and 8 min. ?6. 20.3% of total sleep time was spent in REM sleep. ?7. Shortened sleep onset latency at 6 min. ?8. Prolonged REM sleep onset latency at 113 min. ?9. Total awakenings were 12. ?  ?DIAGNOSIS: ?Severe Obstructive Sleep Apnea (G47.33) ?  ?RECOMMENDATIONS: ?1. Clinical correlation of these findings is necessary. The decision to treat obstructive sleep apnea (OSA) is usually ?based on the presence of apnea symptoms or the presence of associated medical conditions such as Hypertension, ?Congestive Heart Failure, Atrial Fibrillation or Obesity. The most common symptoms of OSA are snoring, gasping for ?breath while sleeping, daytime sleepiness and fatigue. ?  ?2. Initiating apnea therapy is recommended given the presence of symptoms and/or associated conditions. ?Recommend proceeding with one of the following: ?  ? a. Auto-CPAP therapy with a  pressure range of 5-20cm H2O. ?  ? b. An oral appliance (OA) that can be obtained from certain dentists with expertise in sleep medicine. These are ?primarily of use in non-obese patients with mild and moderate disease. ?  ? c. An ENT consultation which may be useful to look for specific causes of obstruction and possible treatment ?options. ?  ? d. If patient is intolerant to PAP therapy, consider referral to ENT for evaluation for hypoglossal nerve stimulator. ?  ?3. Close follow-up is necessary to ensure success with CPAP or oral appliance therapy for maximum benefit . ?  ?4. A follow-up oximetry study on CPAP is recommended to assess the adequacy of therapy and determine the need ?for supplemental oxygen or the potential need for Bi-level therapy. An arterial blood gas to determine the adequacy of ?baseline ventilation and oxygenation should also be considered. ?  ?5. Healthy sleep recommendations include: adequate nightly sleep (normal 7-9 hrs/night), avoidance of caffeine after ?noon and alcohol near bedtime, and maintaining a sleep environment that is cool, dark and quiet. ?  ?6. Weight loss for overweight patients is recommended. Even modest amounts of weight loss can significantly ?improve the severity of sleep apnea. ?  ?7. Snoring recommendations include: weight loss where appropriate, side sleeping, and avoidance of alcohol before ?bed. ?  ?8. Operation of motor vehicle should be avoided when sleepy. ?  ?Signature: ?Electronically Signed: 11/11/21 ?Sharda Keddy, MD; FACC; Diplomat, American Board of Sleep Medicine ?   ?  ? ?

## 2021-11-11 NOTE — Procedures (Signed)
?  ?  ?  SLEEP STUDY REPORT ?Patient Information ?Study Date: 11/10/21 ?Patient Name: Lori Mcbride ?Patient ID: [id number] ?Birth Date: August 01, 2065 ?Age: 56 ?Gender: Female ?Referring Physician:Mark Marlou Porch, MD ?  ?TEST DESCRIPTION: Home sleep apnea testing was completed using the WatchPat, a Type 1 device, utilizing ?peripheral arterial tonometry (PAT), chest movement, actigraphy, pulse oximetry, pulse rate, body position and snore. ?AHI was calculated with apnea and hypopnea using valid sleep time as the denominator. RDI includes apneas, ?hypopneas, and RERAs. The data acquired and the scoring of sleep and all associated events were performed in ?accordance with the recommended standards and specifications as outlined in the AASM Manual for the Scoring of ?Sleep and Associated Events 2.2.0 (2015). ?  ?FINDINGS: ?1. Severe Obstructive Sleep Apnea with AHI 45.5/hr. ?2. Mild Central Sleep Apnea with pAHIc 15.3/hr with 33% [employer]. ?3. Oxygen desaturations as low as 62%. ?4. Severe snoring was present. O2 sats were < 88% for 2 min. ?5. Total sleep time was 9 hrs and 8 min. ?6. 20.3% of total sleep time was spent in REM sleep. ?7. Shortened sleep onset latency at 6 min. ?8. Prolonged REM sleep onset latency at 113 min. ?9. Total awakenings were 12. ?  ?DIAGNOSIS: ?Severe Obstructive Sleep Apnea (G47.33) ?  ?RECOMMENDATIONS: ?1. Clinical correlation of these findings is necessary. The decision to treat obstructive sleep apnea (OSA) is usually ?based on the presence of apnea symptoms or the presence of associated medical conditions such as Hypertension, ?Congestive Heart Failure, Atrial Fibrillation or Obesity. The most common symptoms of OSA are snoring, gasping for ?breath while sleeping, daytime sleepiness and fatigue. ?  ?2. Initiating apnea therapy is recommended given the presence of symptoms and/or associated conditions. ?Recommend proceeding with one of the following: ?  ? a. Auto-CPAP therapy with a  pressure range of 5-20cm H2O. ?  ? b. An oral appliance (OA) that can be obtained from certain dentists with expertise in sleep medicine. These are ?primarily of use in non-obese patients with mild and moderate disease. ?  ? c. An ENT consultation which may be useful to look for specific causes of obstruction and possible treatment ?options. ?  ? d. If patient is intolerant to PAP therapy, consider referral to ENT for evaluation for hypoglossal nerve stimulator. ?  ?3. Close follow-up is necessary to ensure success with CPAP or oral appliance therapy for maximum benefit . ?  ?4. A follow-up oximetry study on CPAP is recommended to assess the adequacy of therapy and determine the need ?for supplemental oxygen or the potential need for Bi-level therapy. An arterial blood gas to determine the adequacy of ?baseline ventilation and oxygenation should also be considered. ?  ?5. Healthy sleep recommendations include: adequate nightly sleep (normal 7-9 hrs/night), avoidance of caffeine after ?noon and alcohol near bedtime, and maintaining a sleep environment that is cool, dark and quiet. ?  ?6. Weight loss for overweight patients is recommended. Even modest amounts of weight loss can significantly ?improve the severity of sleep apnea. ?  ?7. Snoring recommendations include: weight loss where appropriate, side sleeping, and avoidance of alcohol before ?bed. ?  ?8. Operation of motor vehicle should be avoided when sleepy. ?  ?Signature: ?Electronically Signed: 11/11/21 ?Fransico Him, MD; [facility]; [place], [place] Board of Sleep Medicine ?   ?  ? ?

## 2021-11-14 ENCOUNTER — Other Ambulatory Visit (HOSPITAL_COMMUNITY): Payer: Self-pay

## 2021-11-14 ENCOUNTER — Ambulatory Visit (HOSPITAL_COMMUNITY)
Admission: RE | Admit: 2021-11-14 | Discharge: 2021-11-14 | Disposition: A | Discharge status: Home / Self Care | Payer: 59 | Source: Ambulatory Visit | Attending: Cardiology | Admitting: Cardiology

## 2021-11-14 VITALS — BP 114/78 | HR 70 | Wt 181.6 lb

## 2021-11-14 DIAGNOSIS — K219 Gastro-esophageal reflux disease without esophagitis: Secondary | ICD-10-CM | POA: Insufficient documentation

## 2021-11-14 DIAGNOSIS — I340 Nonrheumatic mitral (valve) insufficiency: Secondary | ICD-10-CM | POA: Diagnosis not present

## 2021-11-14 DIAGNOSIS — I5022 Chronic systolic (congestive) heart failure: Secondary | ICD-10-CM

## 2021-11-14 DIAGNOSIS — I447 Left bundle-branch block, unspecified: Secondary | ICD-10-CM | POA: Insufficient documentation

## 2021-11-14 DIAGNOSIS — J90 Pleural effusion, not elsewhere classified: Secondary | ICD-10-CM | POA: Insufficient documentation

## 2021-11-14 DIAGNOSIS — I509 Heart failure, unspecified: Secondary | ICD-10-CM | POA: Insufficient documentation

## 2021-11-14 DIAGNOSIS — E669 Obesity, unspecified: Secondary | ICD-10-CM | POA: Insufficient documentation

## 2021-11-14 DIAGNOSIS — Z79899 Other long term (current) drug therapy: Secondary | ICD-10-CM | POA: Diagnosis not present

## 2021-11-14 DIAGNOSIS — Z6832 Body mass index (BMI) 32.0-32.9, adult: Secondary | ICD-10-CM | POA: Diagnosis not present

## 2021-11-14 DIAGNOSIS — Z8711 Personal history of peptic ulcer disease: Secondary | ICD-10-CM | POA: Insufficient documentation

## 2021-11-14 DIAGNOSIS — Z7984 Long term (current) use of oral hypoglycemic drugs: Secondary | ICD-10-CM | POA: Diagnosis not present

## 2021-11-14 DIAGNOSIS — R0683 Snoring: Secondary | ICD-10-CM | POA: Diagnosis not present

## 2021-11-14 LAB — BASIC METABOLIC PANEL
Anion gap: 5 (ref 5–15)
BUN: 13 mg/dL (ref 6–20)
CO2: 27 mmol/L (ref 22–32)
Calcium: 9.4 mg/dL (ref 8.9–10.3)
Chloride: 109 mmol/L (ref 98–111)
Creatinine, Ser: 0.78 mg/dL (ref 0.44–1.00)
GFR, Estimated: >60 mL/min (ref >60)
Glucose, Bld: 83 mg/dL (ref 70–99)
Potassium: 4 mmol/L (ref 3.5–5.1)
Sodium: 141 mmol/L (ref 135–145)

## 2021-11-14 MED ORDER — POTASSIUM CHLORIDE CRYS ER 10 MEQ PO TBCR: 10.0000 meq | EXTENDED_RELEASE_TABLET | Freq: Every day | ORAL | 6 refills | Status: DC

## 2021-11-14 MED ORDER — FUROSEMIDE 20 MG PO TABS: 20.0000 mg | ORAL_TABLET | Freq: Every day | ORAL | 6 refills | Status: AC | PRN

## 2021-11-14 MED ORDER — ENTRESTO 24-26 MG PO TABS: 1.0000 | ORAL_TABLET | Freq: Two times a day (BID) | ORAL | 3 refills | Status: DC

## 2021-11-14 MED ORDER — METOPROLOL SUCCINATE ER 50 MG PO TB24: 50.0000 mg | ORAL_TABLET | Freq: Every day | ORAL | 3 refills | Status: DC

## 2021-11-14 MED ORDER — POTASSIUM CHLORIDE CRYS ER 10 MEQ PO TBCR: 10.0000 meq | EXTENDED_RELEASE_TABLET | Freq: Every day | ORAL | 6 refills | Status: AC | PRN

## 2021-11-14 NOTE — Patient Instructions (Signed)
It was a pleasure seeing you today! ? ?MEDICATIONS: ?-We are changing your medications today ?-Start Entresto 24/26 mg (1 tablet) twice daily ?-Stop losartan 25 mg daily ?-Change furosemide 20 mg (1 tablet) to as needed for weight gain or swelling ?-Change potassium chloride 10 mEq (1 tablet) to take a dose with each furosemide tablet ?-Call if you have questions about your medications. ? ?LABS: ?-We will call you if your labs need attention. ? ?NEXT APPOINTMENT: ?Return to clinic in 12/09/2021 with Dr. Shirlee Latch. ? ?In general, to take care of your heart failure: ?-Limit your fluid intake to 2 Liters (half-gallon) per day.   ?-Limit your salt intake to ideally 2-3 grams (2000-3000 mg) per day. ?-Weigh yourself daily and record, and bring that "weight diary" to your next appointment.  (Weight gain of 2-3 pounds in 1 day typically means fluid weight.) ?-The medications for your heart are to help your heart and help you live longer.   ?-Please contact us before stopping any of your heart medications. ? ?Call the clinic at [phone] with questions or to reschedule future appointments. ? ?

## 2021-11-14 NOTE — Progress Notes (Signed)
?  ?Advanced Heart Failure Clinic Note  ? ?Referring Physician: Dr. Cathlean Sauer ?Primary Care: Dr. Redmond Pulling ?Primary Cardiologist: Dr. Marlou Porch ?HF Cardiologist: Dr. Aundra Dubin ?  ?HPI: ?Referred to clinic by Dr. Cathlean Sauer with [facility] for heart failure consultation. 56 y.o. female with history of GERD, GI bleed d/t gastric ulcer 12/2019, obesity, LBBB noted on prior ECGs. She is a Restaurant manager, fast food.  ?  ?Admitted 99991111 with new systolic CHF. Sinus tachycardia noted with rates in 120s, apparently had been tachycardic for some time.  Echo with EF 20-25%, RV okay, mild MR, LAE, moderate left pleural effusion. Diuresed with IV Lasix. R/LHC with no significant CAD, CO 11/CI 6, RA 1, PCWP 7 mmHg, LVEDP 11.  She was initiated on GDMT with metoprolol XL 25 mg daily, spironolactone 12.5 mg daily, Farxiga 10 mg daily, ivabradine 5 mg BID, and furosemide 20 mg daily.  ?  ?She presented to clinic on 10/22/2021 and had been feeling much better with improved energy levels since hospital discharge. Denied any family hx of CHF.  No alcohol or drug use. Denied any known viral illness prior to admission.  Denied dyspnea, orthopnea, PND or leg edema. Her husband reported episodes of snoring and witnessed apneic episodes. She had been taking all medications as prescribed. Her home blood pressures had been averaging 0000000 systolic and her home weight was stable at 178 lbs. She was watching fluid and salt intake.She has her own business cleaning homes. She was taking several weeks off work to recover then will go back to work with reduced hours.  ? ?Today she returns to HF clinic for pharmacist medication titration with her husband. At last visit with NP losartan 25 mg daily was initiated. She subsequently had follow-up with general cardiology on 11/06/2021 and was doing well however no medication changes were made. She is feeling great overall since hospital discharge and states that her energy levels have returned to normal. Denies  dizziness, lightheadedness, chest pain or palpitations. Her breathing is good overall and she gets SOB after about 1 hour of moving around that causes her to stop and take a break. She is able to climb stairs and complete all ADLs without SOB. 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 Her weight at home has been stable at 177-178 lbs. She is taking furosemide 20 mg daily and has not needed any extra doses. She has trace LEE on exam, which she states has been normal for her. She denies PND or orthopnea and sleeps on 1 pillow at night. Her appetite has been good and she has been adhering to a low-salt diet.  ? ?HF Medications: ?Metoprolol succinate 50 mg daily  ?Losartan 25 mg daily ?Spironolactone 12.5 mg daily ?Farxiga 10 mg daily ?Corlanor 5 mg BID ?Furosemide 20 mg daily ?Potassium chloride 10 mEq daily ? ?Has the patient been experiencing any side effects to the medications prescribed? No ? ?Does the patient have any problems obtaining medications due to transportation or finances?    ?[employer] health plan ?Corlanor - copay card ?[place]- copay card ?Entresto- 30d free card and copay card ?Have asked SW to reach out and see if we can get help with copays in the short term as they state copays are still too expensive.  ? ?Understanding of regimen: good ?Understanding of indications: good ?Potential of compliance: excellent ?Patient understands to avoid NSAIDs. ?Patient understands to avoid decongestants. ?  ?Pertinent Lab Values: ?Labs  10/22/2021: Serum creatinine 0.86, BUN 15, Potassium 4.1, Sodium 142, BNP 1135 ?Labs 11/14/2021: pending ? ?Vital Signs: ?Weight: 181 lbs (last clinic weight: 181 lbs) ?Blood pressure: 114/78 mmHg ?Heart rate: 70 bpm ? ?Assessment/Plan: ?HFrEF/NICM: ?-New diagnosis during admit 09/2021 ?-Echo 09/2021: EF 20-25%, RV okay, moderately dilated left atrium, moderate left pleural effusions, mild MR ?-R/LHC 09/2021: No  significant CAD, CO 11, CI 6, RA 1, PCWP 7, LVEDP 11 ?-Etiology not certain. No CAD. ? LBBB cardiomyopathy. No family hx CHF or alcohol/drug use. Sinus tachycardia noted for some time prior to admit. cMRI to assess for other possible etiologies such as myocarditis pending. ?-NYHA II. Volume status looks good on exam.  ?- BMET today pending.  ?-Decrease furosemide 20 mg daily + 10 mEq K daily to daily PRN only.  ?-Continue Metoprolol XL 50 mg daily - Both metoprolol succinate 25 and 50 mg were on the patient's medication list. Patient has been taking 50 mg daily as prescribed by the hospitalist. Will continue this dose.  ?-Stop losartan 25 mg daily and start Entresto 24/26 mg BID. Repeat BMET at next appointment ?-Continue Farxiga 10 mg daily ?-Continue spironolactone 12.5 mg daily ?-Continue Corlanor 5 mg BID ?-Home sleep study completed ?- Cardiac MRI scheduled for 11/26/21 ?-Will need repeat echo end of 12/2021 if EF has not improved at time of cMRI. If EF remains < or = 35% will need referral for ICD. Not sure if would be candidate for CRT with QRS 140 ms. 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 Recommended she does not lift more than 30 lbs and limit cleaning projects to no more than 4 hrs initially. ?  ?2. Suspected OSA: ?-Snoring and apneic episodes ?-Home sleep study completed ?  ?3. LBBB: ?-Chronic, noted on prior ECGs ?-Potentially contributing to reduced EF ?-Management as above ? ?Follow up 12/09/2021 with Dr. Aundra Dubin ? ?Audry Riles, PharmD, BCPS, BCCP, CPP ?Heart Failure Clinic Pharmacist ?[phone] ?  ?

## 2021-11-15 ENCOUNTER — Encounter: Payer: 59 | Admitting: Family Medicine

## 2021-11-18 ENCOUNTER — Ambulatory Visit: Payer: 59

## 2021-11-18 ENCOUNTER — Telehealth (HOSPITAL_COMMUNITY): Payer: Self-pay | Admitting: Licensed Clinical Social Worker

## 2021-11-18 DIAGNOSIS — G4733 Obstructive sleep apnea (adult) (pediatric): Secondary | ICD-10-CM

## 2021-11-18 NOTE — Telephone Encounter (Signed)
CSW consulted to reach out to pt about possible financial assistance with medications- unable to reach- left VM requesting return call ? ?Burna Sis, LCSW ?Clinical Social Worker ?[facility] ?Desk#: [phone] ?Cell#: [phone] ? ?

## 2021-11-19 ENCOUNTER — Other Ambulatory Visit: Payer: Self-pay

## 2021-11-19 DIAGNOSIS — G4733 Obstructive sleep apnea (adult) (pediatric): Secondary | ICD-10-CM

## 2021-11-19 NOTE — Telephone Encounter (Addendum)
The patient has been notified of the result. Left detailed message on voicemail and informed patient to call back .Latrelle Dodrill, CMA 11/19/2021 6:04 PM   ?   ?

## 2021-11-19 NOTE — Telephone Encounter (Signed)
From: Quintella Reichert, MD  ?Sent: 11/11/2021   9:53 AM EDT  ?To: Cv Div Sleep Studies  ? ?Please let patient know that they have severe sleep apnea.  Recommend therapeutic CPAP titration for treatment of patient's sleep disordered breathing.  If unable to perform an in lab titration then initiate ResMed auto CPAP from 4 to 15cm H2O with heated humidity and mask of choice and overnight pulse ox on CPAP.     ?

## 2021-11-25 ENCOUNTER — Telehealth (HOSPITAL_COMMUNITY): Payer: Self-pay | Admitting: Emergency Medicine

## 2021-11-25 NOTE — Telephone Encounter (Signed)
Reaching out to patient to offer assistance regarding upcoming cardiac imaging study; pt verbalizes understanding of appt date/time, parking situation and where to check in, and verified current allergies; name and call back number provided for further questions should they arise ?Marchia Bond RN Navigator Cardiac Imaging ?[facility] ?[phone] office ?[phone] cell ? ?L arm preferred ?Denies metal other than nickel tooth crown ?Denies claustro ?Arrival 330 ?  ?

## 2021-11-26 ENCOUNTER — Ambulatory Visit (HOSPITAL_COMMUNITY)
Admission: RE | Admit: 2021-11-26 | Discharge: 2021-11-26 | Disposition: A | Discharge status: Home / Self Care | Payer: 59 | Source: Ambulatory Visit | Attending: Physician Assistant | Admitting: Physician Assistant

## 2021-11-26 DIAGNOSIS — I5020 Unspecified systolic (congestive) heart failure: Secondary | ICD-10-CM | POA: Diagnosis present

## 2021-11-26 MED ORDER — GADOBUTROL 1 MMOL/ML IV SOLN
9.0000 mL | Freq: Once | INTRAVENOUS | Status: AC | PRN
  Administered 2021-11-26: 9 mL via INTRAVENOUS

## 2021-12-09 ENCOUNTER — Ambulatory Visit (HOSPITAL_COMMUNITY)
Admission: RE | Admit: 2021-12-09 | Discharge: 2021-12-09 | Disposition: A | Discharge status: Home / Self Care | Payer: 59 | Source: Ambulatory Visit | Attending: Cardiology | Admitting: Cardiology

## 2021-12-09 VITALS — BP 112/82 | HR 71 | Wt 177.4 lb

## 2021-12-09 DIAGNOSIS — J90 Pleural effusion, not elsewhere classified: Secondary | ICD-10-CM | POA: Insufficient documentation

## 2021-12-09 DIAGNOSIS — K219 Gastro-esophageal reflux disease without esophagitis: Secondary | ICD-10-CM | POA: Diagnosis not present

## 2021-12-09 DIAGNOSIS — I447 Left bundle-branch block, unspecified: Secondary | ICD-10-CM | POA: Insufficient documentation

## 2021-12-09 DIAGNOSIS — I5022 Chronic systolic (congestive) heart failure: Secondary | ICD-10-CM | POA: Diagnosis present

## 2021-12-09 DIAGNOSIS — Z79899 Other long term (current) drug therapy: Secondary | ICD-10-CM | POA: Diagnosis not present

## 2021-12-09 DIAGNOSIS — E669 Obesity, unspecified: Secondary | ICD-10-CM | POA: Diagnosis not present

## 2021-12-09 DIAGNOSIS — I428 Other cardiomyopathies: Secondary | ICD-10-CM | POA: Diagnosis present

## 2021-12-09 DIAGNOSIS — Z8711 Personal history of peptic ulcer disease: Secondary | ICD-10-CM | POA: Insufficient documentation

## 2021-12-09 DIAGNOSIS — G4733 Obstructive sleep apnea (adult) (pediatric): Secondary | ICD-10-CM | POA: Diagnosis not present

## 2021-12-09 LAB — BRAIN NATRIURETIC PEPTIDE: B Natriuretic Peptide: 75.6 pg/mL (ref 0.0–100.0)

## 2021-12-09 LAB — BASIC METABOLIC PANEL
Anion gap: 7 (ref 5–15)
BUN: 12 mg/dL (ref 6–20)
CO2: 26 mmol/L (ref 22–32)
Calcium: 9.6 mg/dL (ref 8.9–10.3)
Chloride: 107 mmol/L (ref 98–111)
Creatinine, Ser: 0.74 mg/dL (ref 0.44–1.00)
GFR, Estimated: >60 mL/min (ref >60)
Glucose, Bld: 108 mg/dL — ABNORMAL HIGH (ref 70–99)
Potassium: 4.1 mmol/L (ref 3.5–5.1)
Sodium: 140 mmol/L (ref 135–145)

## 2021-12-09 MED ORDER — SPIRONOLACTONE 25 MG PO TABS
25.0000 mg | ORAL_TABLET | Freq: Every day | ORAL | 3 refills | Status: DC
Start: 2021-12-09 — End: 2023-03-02

## 2021-12-09 NOTE — Patient Instructions (Signed)
Medication Changes:  Increase Spironolactone to 25 mg daily  Lab Work:  Labs done today, your results will be available in MyChart, we will contact you for abnormal readings.   Testing/Procedures:  Repeat blood work in 10 days  Referrals:  Please follow up with our heart failure pharmacist in 3 weeks    Special Instructions // Education:  none  Follow-Up in: 6 weeks  At the [facility], you and your health needs are our priority. We have a designated team specialized in the treatment of Heart Failure. This Care Team includes your primary Heart Failure Specialized Cardiologist (physician), Advanced Practice Providers (APPs- Physician Assistants and Nurse Practitioners), and Pharmacist who all work together to provide you with the care you need, when you need it.   You may see any of the following providers on your designated Care Team at your next follow up:  Dr Arvilla Meres Dr Carron Curie, NP Robbie Lis, [place] [facility] [place], [place] Karle Plumber, PharmD   Please be sure to bring in all your medications bottles to every appointment.   Need to Contact us:  If you have any questions or concerns before your next appointment please send [place] a message through [place] or call our office at [phone].    TO LEAVE A MESSAGE FOR THE NURSE SELECT OPTION 2, PLEASE LEAVE A MESSAGE INCLUDING: YOUR NAME DATE OF BIRTH CALL BACK NUMBER REASON FOR CALL**this is important as we prioritize the call backs  YOU WILL RECEIVE A CALL BACK THE SAME DAY AS LONG AS YOU CALL BEFORE 4:00 PM

## 2021-12-09 NOTE — Progress Notes (Signed)
Primary Care: Dorna Mai, MD HF Cardiologist: Dr. Aundra Dubin  HPI: Referred to clinic by Dr. Cathlean Sauer with [facility] for heart failure consultation. 56 y.o. female with history of GERD, GI bleed d/t gastric ulcer 06/21, obesity, LBBB noted on prior ECGs. She is a Restaurant manager, fast food.   Admitted [id number] with new systolic CHF. Sinus tachycardia noted with rates in 120s, apparently had been tachycardic for some time.  Echo with EF 20-25%, RV okay, mild MR, LAE, moderate left pleural effusion. Diuresed with IV lasix. R/LHC with no significant CAD, CO 11/CI 6, RA 1, PCWP 7 mmHg, LVEDP 11.  She was initiated on GDMT with Toprol XL 25 mg daily, spiro 12.5 mg daily, Farxiga 10 mg daily, ivabradine 5 mg BID, furosemide 20 mg daily.   Cardiac MRI in 5/23 showed LV EF 30%, severe LV dilation, normal RV with EF 53%, no LGE, mild-moderate MR.    Denies any family hx of CHF.  No alcohol or drug use. Denies any known viral illness prior to admission.   Has her own business cleaning homes.   Patient returns for followup of CHF.  She has been doing quite well recently.  Energy level is good. No significant exertional dyspnea.  No particular limitations.  No orthopnea/PND.  No lightheadedness.  Sleep study showed severe OSA, still waiting for CPAP. Weight down 4 lbs.   ECG (personally reviewed): NSR, LBBB 134 msec  Labs (4/23): K 4, creatinine 0.78, BNP 135  Review of Systems: All systems reviewed and negative except as per HPI.   PMH: 1. Upper GI bleeding: Gastric ulcer 6/21.  2. LBBB 3. OSA: Severe on sleep study.  4. Chronic systolic CHF: Nonischemic cardiomyopathy.   - Echo (3/23): EF 20-25%, normal RV, mild MR.  - RHC/LHC (3/23): No significant CAD; RA 1, mean PCWP 7, CI 6 - Cardiac MRI (5/23): LV EF 30%, severe LV dilation, normal RV with EF 53%, no LGE, mild-moderate MR.    Current Outpatient Medications  Medication Sig Dispense Refill   dapagliflozin propanediol (FARXIGA) 10 MG  TABS tablet Take 10 mg by mouth daily.     fluticasone (VERAMYST) 27.5 MCG/SPRAY nasal spray Place 2 sprays into the nose daily.     furosemide (LASIX) 20 MG tablet Take 1 tablet (20 mg total) by mouth daily as needed for edema or fluid. 30 tablet 6   ivabradine (CORLANOR) 5 MG TABS tablet Take 5 mg by mouth 2 (two) times daily with a meal.     metoprolol succinate (TOPROL-XL) 50 MG 24 hr tablet Take 1 tablet (50 mg total) by mouth daily. Take with or immediately following a meal. 90 tablet 3   pantoprazole (PROTONIX) 20 MG tablet Take 1 tablet (20 mg total) by mouth daily. 30 tablet 0   potassium chloride (KLOR-CON M) 10 MEQ tablet Take 1 tablet (10 mEq total) by mouth daily as needed. Take 1 tablet (10 mEq total) by mouth when taking furosemide. 30 tablet 6   sacubitril-valsartan (ENTRESTO) 24-26 MG Take 1 tablet by mouth 2 (two) times daily. 180 tablet 3   spironolactone (ALDACTONE) 25 MG tablet Take 1 tablet (25 mg total) by mouth daily. 90 tablet 3   No current facility-administered medications for this encounter.    No Known Allergies    Social History   Socioeconomic History   Marital status: Married    Spouse name: [name]   Number of children: 3   Years of education: Not on file  Highest education level: High school graduate  Occupational History   Occupation: housekeeper   Occupation: Aeronautical engineer.  Tobacco Use   Smoking status: Never   Smokeless tobacco: Never  Vaping Use   Vaping Use: Never used  Substance and Sexual Activity   Alcohol use: Never   Drug use: Never   Sexual activity: Yes  Other Topics Concern   Not on file  Social History Narrative   Not on file   Social Determinants of Health   Financial Resource Strain: Low Risk    Difficulty of Paying Living Expenses: Not very hard  Food Insecurity: Food Insecurity Present   Worried About Running Out of Food in the Last Year: Sometimes true   Ran Out of Food in the Last Year: Never true  Transportation  Needs: No Transportation Needs   Lack of Transportation (Medical): No   Lack of Transportation (Non-Medical): No  Physical Activity: Not on file  Stress: Not on file  Social Connections: Not on file  Intimate Partner Violence: Not on file      Family History  Problem Relation Age of Onset   Hypertension Father     Vitals:   12/09/21 1138  BP: 112/82  Pulse: 71  SpO2: 97%  Weight: 80.5 kg (177 lb 6.4 oz)    PHYSICAL EXAM: General: NAD Neck: No JVD, no thyromegaly or thyroid nodule.  Lungs: Clear to auscultation bilaterally with normal respiratory effort. CV: Nondisplaced PMI.  Heart regular S1/S2, no S3/S4, no murmur.  No peripheral edema.  No carotid bruit.  Normal pedal pulses.  Abdomen: Soft, nontender, no hepatosplenomegaly, no distention.  Skin: Intact without lesions or rashes.  Neurologic: Alert and oriented x 3.  Psych: Normal affect. Extremities: No clubbing or cyanosis.  HEENT: Normal.    ASSESSMENT & PLAN: 1. Chronic systolic CHF: Nonischemic cardiomyopathy.  Diagnosed in 3/23, echo showed EF 20-25% with normal RV.  LHC showed no significant CAD.  Patient has been noted to have LBBB.  Cardiac MRI in 5/23 showed LV EF 30%, severe LV dilation, normal RV with EF 53%, no LGE, mild-moderate MR.   Etiology not certain. No CAD; ? LBBB cardiomyopathy; no family hx CHF or alcohol/drug use. No evidence for infiltrative disease or myocarditis on MRI. She is not volume overloaded on exam, NYHA class I symptoms.  - She does not need a loop diuretic.  - Continue Metoprolol XL 50 mg daily - Continue Corlanor 5 mg BID  - Continue Farxiga 10 mg daily - Increase spironolactone to 25 mg daily.  BMET today and in 10 days.  - Continue Entresto 24/26 bid.  - I will repeat echo in 9/23.  If EF remains < or = 35% will need referral for ICD. Somewhat borderline candidate for CRT, has LBBB but not markedly long. 2. OSA: Severe, waiting for CPAP.  3. LBBB: Chronic, noted on prior ECGs.   Potentially contributing to reduced EF - Management as above  Followup with HF pharmacist in 3 wks for med titration, followup 6 wks with APP.   Loralie Champagne 12/10/2021

## 2021-12-09 NOTE — Progress Notes (Incomplete)
Primary Care: Dorna Mai, MD HF Cardiologist: Dr. Aundra Dubin  HPI: Referred to clinic by Dr. Cathlean Sauer with [facility] for heart failure consultation. 56 y.o. female with history of GERD, GI bleed d/t gastric ulcer 06/21, obesity, LBBB noted on prior ECGs. She is a Restaurant manager, fast food.   Admitted [id number] with new systolic CHF. Sinus tachycardia noted with rates in 120s, apparently had been tachycardic for some time.  Echo with EF 20-25%, RV okay, mild MR, LAE, moderate left pleural effusion. Diuresed with IV lasix. R/LHC with no significant CAD, CO 11/CI 6, RA 1, PCWP 7 mmHg, LVEDP 11.  She was initiated on GDMT with Toprol XL 25 mg daily, spiro 12.5 mg daily, Farxiga 10 mg daily, ivabradine 5 mg BID, furosemide 20 mg daily.   Cardiac MRI in 5/23 showed LV EF 30%, severe LV dilation, normal RV with EF 53%, no LGE, mild-moderate MR.    Denies any family hx of CHF.  No alcohol or drug use. Denies any known viral illness prior to admission.   Has her own business cleaning homes.   Patient returns for followup of CHF.  She has been doing quite well recently.  Energy level is good. No significant exertional dyspnea.  No particular limitations.  No orthopnea/PND.  No lightheadedness.  Sleep study showed severe OSA, still waiting for CPAP. Weight down 4 lbs.   ECG (personally reviewed): NSR, LBBB 134 msec  Labs (4/23): K 4, creatinine 0.78, BNP 135  Review of Systems: All systems reviewed and negative except as per HPI.   PMH: 1. Upper GI bleeding: Gastric ulcer 6/21.  2. LBBB 3. OSA: Severe on sleep study.  4. Chronic systolic CHF: Nonischemic cardiomyopathy.   - Echo (3/23): EF 20-25%, normal RV, mild MR.  - RHC/LHC (3/23): No significant CAD; RA 1, mean PCWP 7, CI 6 - Cardiac MRI (5/23): LV EF 30%, severe LV dilation, normal RV with EF 53%, no LGE, mild-moderate MR.    Current Outpatient Medications  Medication Sig Dispense Refill  . dapagliflozin propanediol (FARXIGA) 10 MG  TABS tablet Take 10 mg by mouth daily.    . fluticasone (VERAMYST) 27.5 MCG/SPRAY nasal spray Place 2 sprays into the nose daily.    . furosemide (LASIX) 20 MG tablet Take 1 tablet (20 mg total) by mouth daily as needed for edema or fluid. 30 tablet 6  . ivabradine (CORLANOR) 5 MG TABS tablet Take 5 mg by mouth 2 (two) times daily with a meal.    . metoprolol succinate (TOPROL-XL) 50 MG 24 hr tablet Take 1 tablet (50 mg total) by mouth daily. Take with or immediately following a meal. 90 tablet 3  . pantoprazole (PROTONIX) 20 MG tablet Take 1 tablet (20 mg total) by mouth daily. 30 tablet 0  . potassium chloride (KLOR-CON M) 10 MEQ tablet Take 1 tablet (10 mEq total) by mouth daily as needed. Take 1 tablet (10 mEq total) by mouth when taking furosemide. 30 tablet 6  . sacubitril-valsartan (ENTRESTO) 24-26 MG Take 1 tablet by mouth 2 (two) times daily. 180 tablet 3  . spironolactone (ALDACTONE) 25 MG tablet Take 1 tablet (25 mg total) by mouth daily. 90 tablet 3   No current facility-administered medications for this encounter.    No Known Allergies    Social History   Socioeconomic History  . Marital status: Married    Spouse name: [name]  . Number of children: 3  . Years of education: Not on file  .  Highest education level: High school graduate  Occupational History  . Occupation: Secretary/administrator  . Occupation: Housecleaning.  Tobacco Use  . Smoking status: Never  . Smokeless tobacco: Never  Vaping Use  . Vaping Use: Never used  Substance and Sexual Activity  . Alcohol use: Never  . Drug use: Never  . Sexual activity: Yes  Other Topics Concern  . Not on file  Social History Narrative  . Not on file   Social Determinants of Health   Financial Resource Strain: Low Risk   . Difficulty of Paying Living Expenses: Not very hard  Food Insecurity: Food Insecurity Present  . Worried About Charity fundraiser in the Last Year: Sometimes true  . Ran Out of Food in the Last Year:  Never true  Transportation Needs: No Transportation Needs  . Lack of Transportation (Medical): No  . Lack of Transportation (Non-Medical): No  Physical Activity: Not on file  Stress: Not on file  Social Connections: Not on file  Intimate Partner Violence: Not on file      Family History  Problem Relation Age of Onset  . Hypertension Father     Vitals:   12/09/21 1138  BP: 112/82  Pulse: 71  SpO2: 97%  Weight: 80.5 kg (177 lb 6.4 oz)    PHYSICAL EXAM: General: NAD Neck: No JVD, no thyromegaly or thyroid nodule.  Lungs: Clear to auscultation bilaterally with normal respiratory effort. CV: Nondisplaced PMI.  Heart regular S1/S2, no S3/S4, no murmur.  No peripheral edema.  No carotid bruit.  Normal pedal pulses.  Abdomen: Soft, nontender, no hepatosplenomegaly, no distention.  Skin: Intact without lesions or rashes.  Neurologic: Alert and oriented x 3.  Psych: Normal affect. Extremities: No clubbing or cyanosis.  HEENT: Normal.    ASSESSMENT & PLAN: 1. Chronic systolic CHF: Nonischemic cardiomyopathy.  Diagnosed in 3/23, echo showed EF 20-25% with normal RV.  LHC showed no significant CAD.   -New diagnosis during admit 03/23 -Echo 03/23: EF 20-25%, RV okay, moderately dilated left atrium, moderate left pleural effusions, mild MR -[facility] 03/23: No significant CAD, CO 11, CI 6, RA 1, PCWP 7, LVEDP 11 -Etiology not certain. No CAD. ? LBBB cardiomyopathy. No family hx CHF or alcohol/drug use. Sinus tachycardia noted for some time prior to admit. Will obtain cMRI to assess for other possible etiologies such as myocarditis. -NYHA II. Volume looks good on exam and by ReDS which was 32%. Continue 20 mg furosemide daily + 10 mEq K daily -Continue Metoprolol XL 25 mg daily -Continue Corlanor 5 mg BID (HR 72 today). Has COPAY card. -Continue Farxiga 10 mg daily -Continue spiro 12.5 mg daily -Add Losartan 25 mg daily. If tolerating okay at visit with Cards in 2 weeks, consider  switching to [facility].  -BMET, CBC, BNP today, BMP again in 2 weeks -Home sleep study ordered -Will need repeat echo end of 06/23 if EF has not improved at time of cMRI. If EF remains < or = 35% will need referral for ICD. Not sure if would be candidate for CRT with QRS 140 ms. -Has house cleaning business. Planning to stay out of work 2 weeks post-discharge. Recommended she does not lift more than 30 lb and limit cleaning projects to no more than 4 hrs initially.  2. Suspected OSA: -Snoring and apneic episodes -Home sleep study as above  3. LBBB: -Chronic, noted on prior ECGs -Potentially contributing to reduced EF -Management as above

## 2021-12-09 NOTE — Telephone Encounter (Signed)
Return call: The patient has been notified of the result and verbalized understanding.  All questions (if any) were answered. Latrelle Dodrill, CMA 12/09/2021 5:24 PM    Cpap titration to precert

## 2021-12-09 NOTE — Addendum Note (Signed)
Addended by: Reesa Chew on: 12/09/2021 05:27 PM   Modules accepted: Orders

## 2021-12-12 ENCOUNTER — Telehealth: Payer: Self-pay | Admitting: *Deleted

## 2021-12-12 NOTE — Telephone Encounter (Signed)
Patient has to have a titration first.

## 2021-12-12 NOTE — Telephone Encounter (Signed)
-----   Message from Quintella Reichert, MD sent at 12/12/2021  8:20 AM EDT ----- NIna please find out what is going on with patients CPAP order ----- Message ----- From: Laurey Morale, MD Sent: 12/10/2021  12:20 AM EDT To: Quintella Reichert, MD  This patient has severe OSA, has been waiting for a while now for CPAP.

## 2021-12-19 ENCOUNTER — Ambulatory Visit (HOSPITAL_COMMUNITY)
Admission: RE | Admit: 2021-12-19 | Discharge: 2021-12-19 | Disposition: A | Discharge status: Home / Self Care | Payer: 59 | Source: Ambulatory Visit | Attending: Internal Medicine | Admitting: Internal Medicine

## 2021-12-19 DIAGNOSIS — I5022 Chronic systolic (congestive) heart failure: Secondary | ICD-10-CM | POA: Insufficient documentation

## 2021-12-19 LAB — BASIC METABOLIC PANEL
Anion gap: 7 (ref 5–15)
BUN: 13 mg/dL (ref 6–20)
CO2: 26 mmol/L (ref 22–32)
Calcium: 9.2 mg/dL (ref 8.9–10.3)
Chloride: 109 mmol/L (ref 98–111)
Creatinine, Ser: 1.16 mg/dL — ABNORMAL HIGH (ref 0.44–1.00)
GFR, Estimated: 56 mL/min — ABNORMAL LOW (ref >60)
Glucose, Bld: 89 mg/dL (ref 70–99)
Potassium: 3.9 mmol/L (ref 3.5–5.1)
Sodium: 142 mmol/L (ref 135–145)

## 2021-12-27 ENCOUNTER — Encounter: Payer: 59 | Admitting: Family Medicine

## 2021-12-28 NOTE — Progress Notes (Incomplete)
***In Progress***    Advanced Heart Failure Clinic Note   Primary Care: Dorna Mai, MD HF Cardiologist: Dr. Aundra Dubin  HPI:  Referred to clinic by Dr. Cathlean Sauer with [facility] for heart failure consultation. 56 y.o. female with history of GERD, GI bleed d/t gastric ulcer 12/2019, obesity, LBBB noted on prior ECGs. She is a Restaurant manager, fast food.    Admitted [id number] with new systolic CHF. Sinus tachycardia noted with rates in 120s, apparently had been tachycardic for some time.  Echo with EF 20-25%, RV okay, mild MR, LAE, moderate left pleural effusion. Diuresed with IV lasix. R/LHC with no significant CAD, CO 11/CI 6, RA 1, PCWP 7 mmHg, LVEDP 11.  She was initiated on GDMT with metoprolol succinate 25 mg daily, spironolactone 12.5 mg daily, Farxiga 10 mg daily, ivabradine 5 mg BID, furosemide 20 mg daily.    Cardiac MRI in 11/2021 showed LV EF 30%, severe LV dilation, normal RV with EF 53%, no LGE, mild-moderate MR.     Denies any family hx of CHF.  No alcohol or drug use. Denies any known viral illness prior to admission.    Has her own business cleaning homes.    Patient returns for followup of CHF on 12/09/2021.  She had been doing quite well recently.  Energy level was good. Denied significant exertional dyspnea, particular limitations, orthopnea, PND, or lightheadedness.  Sleep study showed severe OSA, and she was still waiting for CPAP. Weight was down 4 lbs.   Today she returns to HF clinic for pharmacist medication titration. At last visit with MD spironolactone was increased from 12.5 to 25 mg daily.   Overall feeling ***. Dizziness, lightheadedness, fatigue:  Chest pain or palpitations:  How is your breathing?: *** SOB: Able to complete all ADLs. Activity level ***  Weight at home pounds. Does not need a diuretic.  LEE PND/Orthopnea  Appetite *** Low-salt diet:   Physical Exam Cost/affordability of meds   HF Medications: Metoprolol succinate 50 mg  daily Entresto 24/26 mg BID Spironolactone 25 mg daily Farxiga 10 mg daily Ivavabradine 5 mg BID Furosemide 20 mg PRN Potassium chloride 10 mEq PRN with furosemide    Has the patient been experiencing any side effects to the medications prescribed?  {YES NO:22349}  Does the patient have any problems obtaining medications due to transportation or finances?   {YES NO:22349}  Understanding of regimen: {excellent/good/fair/poor:19665} Understanding of indications: {excellent/good/fair/poor:19665} Potential of compliance: {excellent/good/fair/poor:19665} Patient understands to avoid NSAIDs. Patient understands to avoid decongestants.    Pertinent Lab Values: Labs 12/19/2021: Serum creatinine 1.16, BUN 13, Potassium 3.9, Sodium 142   Vital Signs: Weight: *** (last clinic weight: 177.4 lbs) Blood pressure: *** 112/82 Heart rate: *** 71  Plan ?BMET AKI with spiro increase 0.74/0.86> 1.16 kept the same A. Increase metoprolol to 75, decrease ivabradine to 2.5 bid B. Entresto to 49/51, would like to see Scr trend down more first  Assessment/Plan: 1. Chronic systolic CHF: Nonischemic cardiomyopathy.  Diagnosed in 09/2021, echo showed EF 20-25% with normal RV.  LHC showed no significant CAD.  Patient has been noted to have LBBB.  Cardiac MRI in 11/2021 showed LV EF 30%, severe LV dilation, normal RV with EF 53%, no LGE, mild-moderate MR.   Etiology not certain. No CAD; ? LBBB cardiomyopathy; no family hx CHF or alcohol/drug use. No evidence for infiltrative disease or myocarditis on MRI.  She is not volume overloaded on exam, NYHA class I symptoms. *** - She does not need a loop diuretic.  -  Continue metoprolol succinate 50 mg daily - Continue Entresto 24/26 BID - Continue ivabradine 5 mg BID  - Continue Farxiga 10 mg daily - Increase spironolactone to 25 mg daily.   -repeat echo in 03/2022.  If EF remains < or = 35% will need referral for ICD. Somewhat borderline candidate for CRT, has LBBB  but not markedly long. 2. OSA: Severe, waiting for CPAP. *** 3. LBBB: Chronic, noted on prior ECGs.  Potentially contributing to reduced EF - Management as above  Follow up 01/20/2022 with APP.  Audry Riles, PharmD, BCPS, BCCP, CPP Heart Failure Clinic Pharmacist [phone]

## 2021-12-30 ENCOUNTER — Inpatient Hospital Stay (HOSPITAL_COMMUNITY): Admission: RE | Admit: 2021-12-30 | Payer: 59 | Source: Ambulatory Visit

## 2022-01-01 NOTE — Telephone Encounter (Signed)
Prior Authorization for TITRATION sent to [facility] via Fax   APPROVED-TT READ-AUTH#[id number] 01/01/22---04-03-22 6/14  CLINIALS FAXED TO [facility]

## 2022-01-17 ENCOUNTER — Telehealth (HOSPITAL_COMMUNITY): Payer: Self-pay

## 2022-01-17 NOTE — Telephone Encounter (Signed)
Called and left patient a message to confirm/remind patient of their appointment at the Advanced Heart Failure Clinic on 01/20/22.      

## 2022-01-20 ENCOUNTER — Encounter (HOSPITAL_COMMUNITY): Payer: Self-pay

## 2022-01-20 ENCOUNTER — Ambulatory Visit (HOSPITAL_COMMUNITY)
Admission: RE | Admit: 2022-01-20 | Discharge: 2022-01-20 | Disposition: A | Discharge status: Home / Self Care | Payer: 59 | Source: Ambulatory Visit | Attending: Family Medicine | Admitting: Family Medicine

## 2022-01-20 VITALS — BP 120/80 | HR 62 | Wt 173.8 lb

## 2022-01-20 DIAGNOSIS — G4733 Obstructive sleep apnea (adult) (pediatric): Secondary | ICD-10-CM | POA: Diagnosis not present

## 2022-01-20 DIAGNOSIS — I5022 Chronic systolic (congestive) heart failure: Secondary | ICD-10-CM | POA: Diagnosis present

## 2022-01-20 DIAGNOSIS — I447 Left bundle-branch block, unspecified: Secondary | ICD-10-CM

## 2022-01-20 DIAGNOSIS — Z7984 Long term (current) use of oral hypoglycemic drugs: Secondary | ICD-10-CM | POA: Insufficient documentation

## 2022-01-20 DIAGNOSIS — E669 Obesity, unspecified: Secondary | ICD-10-CM | POA: Diagnosis not present

## 2022-01-20 DIAGNOSIS — Z8711 Personal history of peptic ulcer disease: Secondary | ICD-10-CM | POA: Diagnosis not present

## 2022-01-20 DIAGNOSIS — Z8249 Family history of ischemic heart disease and other diseases of the circulatory system: Secondary | ICD-10-CM | POA: Insufficient documentation

## 2022-01-20 DIAGNOSIS — Z79899 Other long term (current) drug therapy: Secondary | ICD-10-CM | POA: Diagnosis not present

## 2022-01-20 DIAGNOSIS — K219 Gastro-esophageal reflux disease without esophagitis: Secondary | ICD-10-CM | POA: Insufficient documentation

## 2022-01-20 DIAGNOSIS — I428 Other cardiomyopathies: Secondary | ICD-10-CM | POA: Insufficient documentation

## 2022-01-20 LAB — BASIC METABOLIC PANEL
Anion gap: 8 (ref 5–15)
BUN: 12 mg/dL (ref 6–20)
CO2: 23 mmol/L (ref 22–32)
Calcium: 9.4 mg/dL (ref 8.9–10.3)
Chloride: 108 mmol/L (ref 98–111)
Creatinine, Ser: 0.68 mg/dL (ref 0.44–1.00)
GFR, Estimated: >60 mL/min (ref >60)
Glucose, Bld: 98 mg/dL (ref 70–99)
Potassium: 4 mmol/L (ref 3.5–5.1)
Sodium: 139 mmol/L (ref 135–145)

## 2022-01-20 MED ORDER — ENTRESTO 49-51 MG PO TABS
1.0000 | ORAL_TABLET | Freq: Two times a day (BID) | ORAL | 6 refills | Status: DC
Start: 2022-01-20 — End: 2022-08-26

## 2022-01-20 NOTE — Progress Notes (Signed)
[facility]    Primary Care: Georganna Skeans, MD HF Cardiologist: Dr. Shirlee Latch  HPI: Lori Mcbride is a 56 y.o. female with history of GERD, GI bleed d/t gastric ulcer 06/21, obesity, LBBB noted on prior ECGs. She is a Scientist, product/process development.   Admitted 3/23 with new systolic CHF. Sinus tachycardia noted with rates in 120s, apparently had been tachycardic for some time.  Echo with EF 20-25%, RV okay, mild MR, LAE, moderate left pleural effusion. Diuresed with IV lasix. R/LHC with no significant CAD, CO 11/CI 6, RA 1, PCWP 7 mmHg, LVEDP 11.  She was initiated on GDMT with Toprol XL 25 mg daily, spiro 12.5 mg daily, Farxiga 10 mg daily, ivabradine 5 mg BID, furosemide 20 mg daily.   Cardiac MRI in 5/23 showed LV EF 30%, severe LV dilation, normal RV with EF 53%, no LGE, mild-moderate MR.    Denies any family hx of CHF.  No alcohol or drug use. Denies any known viral illness prior to admission.   Today she returns for HF follow up with her husband. Overall feeling great. She is not short of breath with activity. Denies palpitations, abnormal bleeding, CP, dizziness, edema, or PND/Orthopnea. Appetite ok. No fever or chills. Weight at home 174 pounds. Taking all medications. Awaiting CPAP. She has her own business cleaning homes.  ECG (personally reviewed): none ordered today.  Labs (4/23): K 4, creatinine 0.78, BNP 135 Labs (6/23): K 3.9, creatinine 1.16  Review of Systems: All systems reviewed and negative except as per HPI.   PMH: 1. Upper GI bleeding: Gastric ulcer 6/21.  2. LBBB 3. OSA: Severe on sleep study.  4. Chronic systolic CHF: Nonischemic cardiomyopathy.   - Echo (3/23): EF 20-25%, normal RV, mild MR.  - RHC/LHC (3/23): No significant CAD; RA 1, mean PCWP 7, CI 6 - Cardiac MRI (5/23): LV EF 30%, severe LV dilation, normal RV with EF 53%, no LGE, mild-moderate MR.   Current Outpatient Medications  Medication Sig Dispense Refill   dapagliflozin propanediol (FARXIGA) 10  MG TABS tablet Take 10 mg by mouth daily.     fluticasone (VERAMYST) 27.5 MCG/SPRAY nasal spray Place 2 sprays into the nose daily as needed.     furosemide (LASIX) 20 MG tablet Take 1 tablet (20 mg total) by mouth daily as needed for edema or fluid. 30 tablet 6   ivabradine (CORLANOR) 5 MG TABS tablet Take 5 mg by mouth 2 (two) times daily with a meal.     metoprolol succinate (TOPROL-XL) 50 MG 24 hr tablet Take 1 tablet (50 mg total) by mouth daily. Take with or immediately following a meal. 90 tablet 3   pantoprazole (PROTONIX) 20 MG tablet Take 1 tablet (20 mg total) by mouth daily. 30 tablet 0   potassium chloride (KLOR-CON M) 10 MEQ tablet Take 1 tablet (10 mEq total) by mouth daily as needed. Take 1 tablet (10 mEq total) by mouth when taking furosemide. 30 tablet 6   sacubitril-valsartan (ENTRESTO) 24-26 MG Take 1 tablet by mouth 2 (two) times daily. 180 tablet 3   spironolactone (ALDACTONE) 25 MG tablet Take 1 tablet (25 mg total) by mouth daily. 90 tablet 3   No current facility-administered medications for this encounter.   No Known Allergies  Social History   Socioeconomic History   Marital status: Married    Spouse name: Alecia Lemming   Number of children: 3   Years of education: Not on file   Highest education level: High school  graduate  Occupational History   Occupation: housekeeper   Occupation: English as a second language teacher.  Tobacco Use   Smoking status: Never   Smokeless tobacco: Never  Vaping Use   Vaping Use: Never used  Substance and Sexual Activity   Alcohol use: Never   Drug use: Never   Sexual activity: Yes  Other Topics Concern   Not on file  Social History Narrative   Not on file   Social Determinants of Health   Financial Resource Strain: Low Risk  (10/11/2021)   Overall Financial Resource Strain (CARDIA)    Difficulty of Paying Living Expenses: Not very hard  Food Insecurity: Food Insecurity Present (10/11/2021)   Hunger Vital Sign    Worried About Running Out of  Food in the Last Year: Sometimes true    Ran Out of Food in the Last Year: Never true  Transportation Needs: No Transportation Needs (10/11/2021)   PRAPARE - Administrator, Civil Service (Medical): No    Lack of Transportation (Non-Medical): No  Physical Activity: Not on file  Stress: Not on file  Social Connections: Not on file  Intimate Partner Violence: Not on file   Family History  Problem Relation Age of Onset   Hypertension Father    BP 120/80   Pulse 62   Wt 78.8 kg (173 lb 12.8 oz)   SpO2 97%   BMI 30.79 kg/m   Wt Readings from Last 3 Encounters:  01/20/22 78.8 kg (173 lb 12.8 oz)  12/09/21 80.5 kg (177 lb 6.4 oz)  11/14/21 82.4 kg (181 lb 9.6 oz)   PHYSICAL EXAM: General:  NAD. No resp difficulty HEENT: Normal Neck: Supple. No JVD. Carotids 2+ bilat; no bruits. No lymphadenopathy or thryomegaly appreciated. Cor: PMI nondisplaced. Regular rate & rhythm. No rubs, gallops or murmurs. Lungs: Clear Abdomen: Soft, nontender, nondistended. No hepatosplenomegaly. No bruits or masses. Good bowel sounds. Extremities: No cyanosis, clubbing, rash, edema Neuro: Alert & oriented x 3, cranial nerves grossly intact. Moves all 4 extremities w/o difficulty. Affect pleasant.  ASSESSMENT & PLAN: 1. Chronic systolic CHF: Nonischemic cardiomyopathy.  Diagnosed in 3/23, echo showed EF 20-25% with normal RV.  LHC showed no significant CAD.  Patient has been noted to have LBBB.  Cardiac MRI in 5/23 showed LV EF 30%, severe LV dilation, normal RV with EF 53%, no LGE, mild-moderate MR.   Etiology not certain. No CAD; ? LBBB cardiomyopathy; no family hx CHF or alcohol/drug use. No evidence for infiltrative disease or myocarditis on MRI. She is not volume overloaded on exam, NYHA class I symptoms.  - Increase Entresto to 49/51 mg bid. BMET today, repeat in 10-14 days. - She does not need a loop diuretic.  - Continue Toprol XL 50 mg daily - Continue Corlanor 5 mg bid. HR 62 today.  -  Continue Farxiga 10 mg daily - Continue spironolactone 25 mg daily.  - Repeat echo in 9/23.  If EF remains < or = 35% will need referral for ICD. Somewhat borderline candidate for CRT, has LBBB but not markedly long. 2. OSA: Severe, waiting for CPAP. Will send a message to Dr. Norris Cross office. 3. LBBB: Chronic, noted on prior ECGs.  Potentially contributing to reduced EF - Management as above  Follow up in 2 months with Dr. Shirlee Latch + echo.  [facility] FNP-BC 01/20/2022

## 2022-01-20 NOTE — Patient Instructions (Addendum)
Thank you for coming in today  Labs were done today, if any labs are abnormal the clinic will call you No news is good news  Your physician recommends that you return for lab work in: 10-14 days for BMET  Your physician recommends that you schedule a follow-up appointment in:  2-3 months with Dr. Shirlee Latch with echocardiogram  Your physician has requested that you have an echocardiogram. Echocardiography is a painless test that uses sound waves to create images of your heart. It provides your doctor with information about the size and shape of your heart and how well your heart's chambers and valves are working. This procedure takes approximately one hour. There are no restrictions for this procedure.   INCREASE Entresto to 49/51 mg 1 tablet twice daily .Marland Kitchen..you can double up on your 24/26 mg until you are out   At the [facility], you and your health needs are our priority. As part of our continuing mission to provide you with exceptional heart care, we have created designated Provider Care Teams. These Care Teams include your primary Cardiologist (physician) and Advanced Practice Providers (APPs- Physician Assistants and Nurse Practitioners) who all work together to provide you with the care you need, when you need it.   You may see any of the following providers on your designated Care Team at your next follow up: Dr Arvilla Meres Dr Carron Curie, NP Robbie Lis, [place] [facility] [place], [place] Karle Plumber, PharmD   Please be sure to bring in all your medications bottles to every appointment.   If you have any questions or concerns before your next appointment please send [place] a message through [place] or call our office at [phone].    TO LEAVE A MESSAGE FOR THE NURSE SELECT OPTION 2, PLEASE LEAVE A MESSAGE INCLUDING: YOUR NAME DATE OF BIRTH CALL BACK NUMBER REASON FOR CALL**this is important as we prioritize the call backs  YOU WILL  RECEIVE A CALL BACK THE SAME DAY AS LONG AS YOU CALL BEFORE 4:00 PM

## 2022-01-27 ENCOUNTER — Ambulatory Visit (HOSPITAL_COMMUNITY)
Admission: RE | Admit: 2022-01-27 | Discharge: 2022-01-27 | Disposition: A | Discharge status: Home / Self Care | Payer: 59 | Source: Ambulatory Visit | Attending: Internal Medicine | Admitting: Internal Medicine

## 2022-01-27 DIAGNOSIS — I5022 Chronic systolic (congestive) heart failure: Secondary | ICD-10-CM | POA: Diagnosis present

## 2022-01-27 LAB — BASIC METABOLIC PANEL
Anion gap: 6 (ref 5–15)
BUN: 14 mg/dL (ref 6–20)
CO2: 26 mmol/L (ref 22–32)
Calcium: 9.6 mg/dL (ref 8.9–10.3)
Chloride: 106 mmol/L (ref 98–111)
Creatinine, Ser: 0.73 mg/dL (ref 0.44–1.00)
GFR, Estimated: >60 mL/min (ref >60)
Glucose, Bld: 103 mg/dL — ABNORMAL HIGH (ref 70–99)
Potassium: 4.6 mmol/L (ref 3.5–5.1)
Sodium: 138 mmol/L (ref 135–145)

## 2022-03-31 ENCOUNTER — Ambulatory Visit (HOSPITAL_BASED_OUTPATIENT_CLINIC_OR_DEPARTMENT_OTHER)
Admission: RE | Admit: 2022-03-31 | Discharge: 2022-03-31 | Disposition: A | Discharge status: Home / Self Care | Payer: 59 | Source: Ambulatory Visit | Attending: Cardiology | Admitting: Cardiology

## 2022-03-31 ENCOUNTER — Encounter (HOSPITAL_COMMUNITY): Payer: Self-pay | Admitting: Cardiology

## 2022-03-31 ENCOUNTER — Ambulatory Visit (HOSPITAL_COMMUNITY)
Admission: RE | Admit: 2022-03-31 | Discharge: 2022-03-31 | Disposition: A | Discharge status: Home / Self Care | Payer: 59 | Source: Ambulatory Visit | Attending: Cardiology | Admitting: Cardiology

## 2022-03-31 VITALS — BP 110/70 | HR 64 | Wt 174.6 lb

## 2022-03-31 DIAGNOSIS — E669 Obesity, unspecified: Secondary | ICD-10-CM | POA: Insufficient documentation

## 2022-03-31 DIAGNOSIS — Z8711 Personal history of peptic ulcer disease: Secondary | ICD-10-CM | POA: Diagnosis not present

## 2022-03-31 DIAGNOSIS — K219 Gastro-esophageal reflux disease without esophagitis: Secondary | ICD-10-CM | POA: Diagnosis not present

## 2022-03-31 DIAGNOSIS — Z7984 Long term (current) use of oral hypoglycemic drugs: Secondary | ICD-10-CM | POA: Diagnosis not present

## 2022-03-31 DIAGNOSIS — I5022 Chronic systolic (congestive) heart failure: Secondary | ICD-10-CM

## 2022-03-31 DIAGNOSIS — G4733 Obstructive sleep apnea (adult) (pediatric): Secondary | ICD-10-CM | POA: Diagnosis not present

## 2022-03-31 DIAGNOSIS — I428 Other cardiomyopathies: Secondary | ICD-10-CM | POA: Diagnosis not present

## 2022-03-31 DIAGNOSIS — Z79899 Other long term (current) drug therapy: Secondary | ICD-10-CM | POA: Insufficient documentation

## 2022-03-31 DIAGNOSIS — I447 Left bundle-branch block, unspecified: Secondary | ICD-10-CM | POA: Insufficient documentation

## 2022-03-31 LAB — BASIC METABOLIC PANEL
Anion gap: 9 (ref 5–15)
BUN: 12 mg/dL (ref 6–20)
CO2: 21 mmol/L — ABNORMAL LOW (ref 22–32)
Calcium: 9.2 mg/dL (ref 8.9–10.3)
Chloride: 107 mmol/L (ref 98–111)
Creatinine, Ser: 0.64 mg/dL (ref 0.44–1.00)
GFR, Estimated: >60 mL/min (ref >60)
Glucose, Bld: 106 mg/dL — ABNORMAL HIGH (ref 70–99)
Potassium: 4.1 mmol/L (ref 3.5–5.1)
Sodium: 137 mmol/L (ref 135–145)

## 2022-03-31 LAB — BRAIN NATRIURETIC PEPTIDE: B Natriuretic Peptide: 37.1 pg/mL (ref 0.0–100.0)

## 2022-03-31 LAB — ECHOCARDIOGRAM COMPLETE
Area-P 1/2: 2.35 cm2
S' Lateral: 4.5 cm

## 2022-03-31 MED ORDER — METOPROLOL SUCCINATE ER 50 MG PO TB24: 50.0000 mg | ORAL_TABLET | Freq: Two times a day (BID) | ORAL | 3 refills | Status: DC

## 2022-03-31 NOTE — Progress Notes (Signed)
Primary Care: Georganna Skeans, MD HF Cardiologist: Dr. Shirlee Latch  HPI: Referred to clinic by Dr. Ella Jubilee with [facility] for heart failure consultation. 56 y.o. female with history of GERD, GI bleed d/t gastric ulcer 06/21, obesity, LBBB noted on prior ECGs. She is a Scientist, product/process development.   Admitted 10/09/21 with new systolic CHF. Sinus tachycardia noted with rates in 120s, apparently had been tachycardic for some time.  Echo with EF 20-25%, RV okay, mild MR, LAE, moderate left pleural effusion. Diuresed with IV lasix. R/LHC with no significant CAD, CO 11/CI 6, RA 1, PCWP 7 mmHg, LVEDP 11.  She was initiated on GDMT with Toprol XL 25 mg daily, spiro 12.5 mg daily, Farxiga 10 mg daily, ivabradine 5 mg BID, furosemide 20 mg daily.   Cardiac MRI in 5/23 showed LV EF 30%, severe LV dilation, normal RV with EF 53%, no LGE, mild-moderate MR.    Denies any family hx of CHF.  No alcohol or drug use. Denies any known viral illness prior to admission.   Has her own business cleaning homes.   Echo was done today and reviewed, EF 30-35%, normal RV, IVC normal.   Patient returns for followup of CHF.  She is doing well overall.  She denies dyspnea or chest pain with exertion.  No orthopnea/PND.  No lightheadedness.  Weight is stable. No palpitations.   ECG (personally reviewed): NSR, IVCD 134 msec  Labs (4/23): K 4, creatinine 0.78, BNP 135 Labs (7/23): K 4.6, creatinine 0.73  Review of Systems: All systems reviewed and negative except as per HPI.   PMH: 1. Upper GI bleeding: Gastric ulcer 6/21.  2. LBBB 3. OSA: Severe on sleep study.  4. Chronic systolic CHF: Nonischemic cardiomyopathy.   - Echo (3/23): EF 20-25%, normal RV, mild MR.  - RHC/LHC (3/23): No significant CAD; RA 1, mean PCWP 7, CI 6 - Cardiac MRI (5/23): LV EF 30%, severe LV dilation, normal RV with EF 53%, no LGE, mild-moderate MR.  - Echo (9/23): EF 30-35%, normal RV, IVC normal.   Current Outpatient Medications   Medication Sig Dispense Refill   dapagliflozin propanediol (FARXIGA) 10 MG TABS tablet Take 10 mg by mouth daily.     fluticasone (VERAMYST) 27.5 MCG/SPRAY nasal spray Place 2 sprays into the nose daily as needed.     furosemide (LASIX) 20 MG tablet Take 1 tablet (20 mg total) by mouth daily as needed for edema or fluid. 30 tablet 6   potassium chloride (KLOR-CON M) 10 MEQ tablet Take 1 tablet (10 mEq total) by mouth daily as needed. Take 1 tablet (10 mEq total) by mouth when taking furosemide. 30 tablet 6   sacubitril-valsartan (ENTRESTO) 49-51 MG Take 1 tablet by mouth 2 (two) times daily. 60 tablet 6   spironolactone (ALDACTONE) 25 MG tablet Take 1 tablet (25 mg total) by mouth daily. 90 tablet 3   metoprolol succinate (TOPROL-XL) 50 MG 24 hr tablet Take 1 tablet (50 mg total) by mouth 2 (two) times daily. Take with or immediately following a meal. 180 tablet 3   No current facility-administered medications for this encounter.    No Known Allergies    Social History   Socioeconomic History   Marital status: Married    Spouse name: Alecia Lemming   Number of children: 3   Years of education: Not on file   Highest education level: High school graduate  Occupational History   Occupation: housekeeper   Occupation: English as a second language teacher.  Tobacco Use  Smoking status: Never   Smokeless tobacco: Never  Vaping Use   Vaping Use: Never used  Substance and Sexual Activity   Alcohol use: Never   Drug use: Never   Sexual activity: Yes  Other Topics Concern   Not on file  Social History Narrative   Not on file   Social Determinants of Health   Financial Resource Strain: Low Risk  (10/11/2021)   Overall Financial Resource Strain (CARDIA)    Difficulty of Paying Living Expenses: Not very hard  Food Insecurity: Food Insecurity Present (10/11/2021)   Hunger Vital Sign    Worried About Running Out of Food in the Last Year: Sometimes true    Ran Out of Food in the Last Year: Never true   Transportation Needs: No Transportation Needs (10/11/2021)   PRAPARE - Administrator, Civil Service (Medical): No    Lack of Transportation (Non-Medical): No  Physical Activity: Not on file  Stress: Not on file  Social Connections: Not on file  Intimate Partner Violence: Not on file      Family History  Problem Relation Age of Onset   Hypertension Father     Vitals:   03/31/22 1121  BP: 110/70  Pulse: 64  SpO2: 97%  Weight: 79.2 kg (174 lb 9.6 oz)    PHYSICAL EXAM: General: NAD Neck: No JVD, no thyromegaly or thyroid nodule.  Lungs: Clear to auscultation bilaterally with normal respiratory effort. CV: Nondisplaced PMI.  Heart regular S1/S2, no S3/S4, no murmur.  No peripheral edema.  No carotid bruit.  Normal pedal pulses.  Abdomen: Soft, nontender, no hepatosplenomegaly, no distention.  Skin: Intact without lesions or rashes.  Neurologic: Alert and oriented x 3.  Psych: Normal affect. Extremities: No clubbing or cyanosis.  HEENT: Normal.  ASSESSMENT & PLAN: 1. Chronic systolic CHF: Nonischemic cardiomyopathy.  Diagnosed in 3/23, echo showed EF 20-25% with normal RV.  LHC showed no significant CAD.  Patient has been noted to have LBBB.  Cardiac MRI in 5/23 showed LV EF 30%, severe LV dilation, normal RV with EF 53%, no LGE, mild-moderate MR.   Echo today showed EF 30-35%, normal RV, IVC normal.  Etiology not certain. No CAD; ? LBBB cardiomyopathy though QRS is NOT markedly wide (134 msec today); no family hx CHF or alcohol/drug use. No evidence for infiltrative disease or myocarditis on MRI. She is not volume overloaded on exam, NYHA class I symptoms.  - She does not need a loop diuretic.  - I think she can stop Corlanor today and increase Toprol XL to 50 mg bid.  - Continue Farxiga 10 mg daily - Continue spironolactone 25 mg daily.  BMET/BNP today.  - Continue Entresto to 49/51 bid.  - EF has improved some since 3/23 but still < 35%.  She has a nonischemic  cardiomyopathy and no LGE on MRI. She is reluctant to have an ICD placed and QRS does not seem wide enough to predict significant CRT benefit.  Therefore, reasonable to repeat echo at 6 months to look for further improvement before placing ICD.  2. OSA: Needs CPAP titration.  3. LBBB: Chronic, noted on prior ECGs.  However, not markedly wide with QRS 134 msec.  Not sure she would benefit from CRT.   Followup 2 months with APP.    Marca Ancona 03/31/2022

## 2022-03-31 NOTE — Patient Instructions (Signed)
Stop Ivabardine  Increase Toprol XL to 50mg  Twice daily  Labs done today, your results will be available in MyChart, we will contact you for abnormal readings.  Your physician has requested that you have an echocardiogram. Echocardiography is a painless test that uses sound waves to create images of your heart. It provides your doctor with information about the size and shape of your heart and how well your heart's chambers and valves are working. This procedure takes approximately one hour. There are no restrictions for this procedure.  You provider has ordered a sleep study for you. You will be called to arrange the test.  Your physician recommends that you schedule a follow-up appointment in: 6 months ( March 2024)  ** please call the office in January to arrange your follow up appointment **  If you have any questions or concerns before your next appointment please send [place] a message through [place] or call our office at [phone].    TO LEAVE A MESSAGE FOR THE NURSE SELECT OPTION 2, PLEASE LEAVE A MESSAGE INCLUDING: YOUR NAME DATE OF BIRTH CALL BACK NUMBER REASON FOR CALL**this is important as we prioritize the call backs  YOU WILL RECEIVE A CALL BACK THE SAME DAY AS LONG AS YOU CALL BEFORE 4:00 PM  At the [facility], you and your health needs are our priority. As part of our continuing mission to provide you with exceptional heart care, we have created designated Provider Care Teams. These Care Teams include your primary Cardiologist (physician) and Advanced Practice Providers (APPs- Physician Assistants and Nurse Practitioners) who all work together to provide you with the care you need, when you need it.   You may see any of the following providers on your designated Care Team at your next follow up: Dr [phone] Dr Arvilla Meres Dr. Marca Ancona, NP Marcos Eke, Robbie Lis [facility] [place], [place], NP Brynda Peon,  PharmD   Please be sure to bring in all your medications bottles to every appointment.

## 2022-04-28 ENCOUNTER — Other Ambulatory Visit (HOSPITAL_COMMUNITY): Payer: Self-pay

## 2022-04-29 ENCOUNTER — Other Ambulatory Visit (HOSPITAL_COMMUNITY): Payer: Self-pay

## 2022-04-30 ENCOUNTER — Telehealth (HOSPITAL_COMMUNITY): Payer: Self-pay | Admitting: Pharmacy Technician

## 2022-04-30 ENCOUNTER — Other Ambulatory Visit (HOSPITAL_COMMUNITY): Payer: Self-pay

## 2022-04-30 NOTE — Telephone Encounter (Signed)
Advanced Heart Failure Patient Advocate Encounter  Prior Authorization for [place] has been submitted and approved.    PA# [id number] Effective dates: 04/30/22 through 05/01/23  Patients co-pay is $25 (30 days)  Charlann Boxer, CPhT

## 2022-05-12 ENCOUNTER — Other Ambulatory Visit (HOSPITAL_COMMUNITY): Payer: Self-pay | Admitting: Physician Assistant

## 2022-06-10 ENCOUNTER — Ambulatory Visit (HOSPITAL_BASED_OUTPATIENT_CLINIC_OR_DEPARTMENT_OTHER): Payer: 59 | Attending: Cardiology | Admitting: Cardiology

## 2022-06-10 ENCOUNTER — Encounter (HOSPITAL_BASED_OUTPATIENT_CLINIC_OR_DEPARTMENT_OTHER): Payer: Self-pay | Admitting: Cardiology

## 2022-06-10 VITALS — Ht 64.0 in | Wt 173.0 lb

## 2022-06-10 DIAGNOSIS — G4733 Obstructive sleep apnea (adult) (pediatric): Secondary | ICD-10-CM

## 2022-06-10 HISTORY — DX: Obstructive sleep apnea (adult) (pediatric): G47.33

## 2022-06-16 NOTE — Procedures (Signed)
   Patient Name: Lori Mcbride, Lori Mcbride Study Date: 06/10/2022 Gender: Female D.O.B: 08/20/1965 Age (years): 55 Referring Provider: Jamelle Goldston MD, ABSM Height (inches): 64 Interpreting Physician: Jahnyla Parrillo MD, ABSM Weight (lbs): 173 RPSGT: Gregory, Kenyon BMI: 30 MRN: [id number] Neck Size: 13.50  CLINICAL INFORMATION The patient is referred for a CPAP titration to treat sleep apnea.  SLEEP STUDY TECHNIQUE As per the AASM Manual for the Scoring of Sleep and Associated Events v2.3 (April 2016) with a hypopnea requiring 4% desaturations.  The channels recorded and monitored were frontal, central and occipital EEG, electrooculogram (EOG), submentalis EMG (chin), nasal and oral airflow, thoracic and abdominal wall motion, anterior tibialis EMG, snore microphone, electrocardiogram, and pulse oximetry. Continuous positive airway pressure (CPAP) was initiated at the beginning of the study and titrated to treat sleep-disordered breathing.  MEDICATIONS Medications self-administered by patient taken the night of the study : N/A  TECHNICIAN COMMENTS Comments added by technician: NONE Comments added by scorer: N/A  RESPIRATORY PARAMETERS Optimal PAP Pressure (cm): 10  AHI at Optimal Pressure (/hr):0 Overall Minimal O2 (%):90.0 Supine % at Optimal Pressure (%):100 Minimal O2 at Optimal Pressure (%): 91.0   SLEEP ARCHITECTURE The study was initiated at 9:52:21 PM and ended at 4:51:57 AM.  Sleep onset time was 48.8 minutes and the sleep efficiency was 67.4%. The total sleep time was 283 minutes.  The patient spent 6.4% of the night in stage N1 sleep, 76.1% in stage N2 sleep, 0.0% in stage N3 and 17.5% in REM.Stage REM latency was 110.5 minutes  Wake after sleep onset was 87.8. Alpha intrusion was absent. Supine sleep was 74.03%.  CARDIAC DATA The 2 lead EKG demonstrated sinus rhythm. The mean heart rate was 60.5 beats per minute. Other EKG findings include: PSVT.  LEG MOVEMENT DATA The  total Periodic Limb Movements of Sleep (PLMS) were 0. The PLMS index was 0.0. A PLMS index of <15 is considered normal in adults.  IMPRESSIONS - The optimal PAP pressure was 10 cm of water. - Central sleep apnea was not noted during this titration (CAI = 0.4/h). - Significant oxygen desaturations were not observed during this titration (min O2 = 90.0%). - The patient snored with soft snoring volume during this titration study. - Short burst of SVT was observed during this study. - Clinically significant periodic limb movements were not noted during this study. Arousals associated with PLMs were rare.  DIAGNOSIS - Obstructive Sleep Apnea (G47.33)  RECOMMENDATIONS - Trial of CPAP therapy on 10 cm H2O with a Large size Fisher&Paykel Full Face Evora Full mask and heated humidification. - Avoid alcohol, sedatives and other CNS depressants that may worsen sleep apnea and disrupt normal sleep architecture. - Sleep hygiene should be reviewed to assess factors that may improve sleep quality. - Weight management and regular exercise should be initiated or continued. - Return to Sleep Center for re-evaluation after 4 weeks of therapy  [Electronically signed] 06/16/2022 12:55 PM  Rayen Dafoe MD, ABSM Diplomate, American Board of Sleep Medicine 

## 2022-06-19 ENCOUNTER — Telehealth: Payer: Self-pay | Admitting: *Deleted

## 2022-06-19 NOTE — Telephone Encounter (Signed)
The patient has been notified of the result and verbalized understanding.  All questions (if any) were answered. Lori Mcbride, CMA 06/19/2022 3:55 PM    Upon patient request DME selection is [facility] Patient understands he will be contacted by [facility] to set up his cpap. Patient understands to call if [facility] does not contact him with new setup in a timely manner. Patient understands they will be called once confirmation has been received from [facility] that they have received their new machine to schedule 10 week follow up appointment.   [facility] notified of new cpap order  Please add to airview Patient was grateful for the call and thanked me.

## 2022-06-19 NOTE — Telephone Encounter (Signed)
-----   Message from Gaynelle Cage, CMA sent at 06/16/2022  1:51 PM EST -----  ----- Message ----- From: Quintella Reichert, MD Sent: 06/16/2022   1:11 PM EST To: Cv Div Sleep Studies  Please let patient know that they had a successful PAP titration and let DME know that orders are in EPIC.  Please set up 6 week OV with me.

## 2022-07-03 ENCOUNTER — Other Ambulatory Visit (HOSPITAL_COMMUNITY): Payer: Self-pay | Admitting: Physician Assistant

## 2022-07-15 ENCOUNTER — Other Ambulatory Visit (HOSPITAL_COMMUNITY): Payer: Self-pay | Admitting: Physician Assistant

## 2022-08-06 NOTE — Telephone Encounter (Addendum)
Fax came from [facility] saying the order for this patient had been voided due to no patient contact. Reached out to Mr Delpriore and he states his wife does not have the money to afford the cpap machine up front but she can afford the $40 a month. [facility] will soft void the order until patient is ready to get her unit.

## 2022-08-13 ENCOUNTER — Other Ambulatory Visit (HOSPITAL_COMMUNITY): Payer: Self-pay | Admitting: Physician Assistant

## 2022-08-15 ENCOUNTER — Telehealth: Payer: Self-pay

## 2022-08-15 NOTE — Telephone Encounter (Signed)
Prior auth now required by insurance for [employer]

## 2022-08-18 ENCOUNTER — Other Ambulatory Visit (HOSPITAL_COMMUNITY): Payer: Self-pay

## 2022-08-18 NOTE — Telephone Encounter (Signed)
**Note De-Identified Michaele Amundson Obfuscation** Suanne Marker, Pharmacist from [place] called to let us know that even though this PA was approved for this pt, her Corlanor will cost her over $550/30 day supply. This is most likely due to a deductible since this will be her 1st refill of this year. I advise Suanne Marker to have the pt call us if she cannot afford this cost.

## 2022-08-18 NOTE — Telephone Encounter (Signed)
**Note De-Identified [name] Obfuscation** Lori Mcbride (Key: [id number]) PA Case ID #: [id number] Outcome: Approved today Coverage Start Date:08/18/2022;Coverage End Date:08/18/2023; Authorization Expiration Date: 08/17/2023 Drug Corlanor 5MG  tablets ePA cloud logo Form Librarian, academic PA Form (2017 NCPDP)  I have notified [place], [place] (Ph: [phone]) of this approval.

## 2022-08-18 NOTE — Telephone Encounter (Signed)
Rep with [facility] called back. Transferred to [employer].

## 2022-08-25 ENCOUNTER — Other Ambulatory Visit (HOSPITAL_COMMUNITY): Payer: Self-pay | Admitting: Family Medicine

## 2022-09-26 ENCOUNTER — Other Ambulatory Visit (HOSPITAL_COMMUNITY): Payer: Self-pay | Admitting: Family Medicine

## 2022-10-26 ENCOUNTER — Other Ambulatory Visit (HOSPITAL_COMMUNITY): Payer: Self-pay | Admitting: Physician Assistant

## 2022-10-26 ENCOUNTER — Other Ambulatory Visit (HOSPITAL_COMMUNITY): Payer: Self-pay | Admitting: Family Medicine

## 2022-11-25 ENCOUNTER — Other Ambulatory Visit (HOSPITAL_COMMUNITY): Payer: Self-pay | Admitting: Physician Assistant

## 2022-11-25 ENCOUNTER — Other Ambulatory Visit (HOSPITAL_COMMUNITY): Payer: Self-pay | Admitting: Family Medicine

## 2023-01-12 ENCOUNTER — Encounter (HOSPITAL_COMMUNITY): Payer: Self-pay | Admitting: Cardiology

## 2023-01-12 ENCOUNTER — Ambulatory Visit (HOSPITAL_COMMUNITY)
Admission: RE | Admit: 2023-01-12 | Discharge: 2023-01-12 | Disposition: A | Discharge status: Home / Self Care | Payer: Commercial Managed Care - HMO | Source: Ambulatory Visit | Attending: Cardiology | Admitting: Cardiology

## 2023-01-12 ENCOUNTER — Other Ambulatory Visit (HOSPITAL_COMMUNITY): Payer: Self-pay

## 2023-01-12 VITALS — BP 108/78 | HR 86 | Wt 175.8 lb

## 2023-01-12 DIAGNOSIS — I5022 Chronic systolic (congestive) heart failure: Secondary | ICD-10-CM

## 2023-01-12 DIAGNOSIS — E669 Obesity, unspecified: Secondary | ICD-10-CM | POA: Insufficient documentation

## 2023-01-12 DIAGNOSIS — Z8249 Family history of ischemic heart disease and other diseases of the circulatory system: Secondary | ICD-10-CM | POA: Insufficient documentation

## 2023-01-12 DIAGNOSIS — Z8711 Personal history of peptic ulcer disease: Secondary | ICD-10-CM | POA: Insufficient documentation

## 2023-01-12 DIAGNOSIS — K219 Gastro-esophageal reflux disease without esophagitis: Secondary | ICD-10-CM | POA: Diagnosis not present

## 2023-01-12 DIAGNOSIS — I428 Other cardiomyopathies: Secondary | ICD-10-CM | POA: Insufficient documentation

## 2023-01-12 DIAGNOSIS — I447 Left bundle-branch block, unspecified: Secondary | ICD-10-CM | POA: Insufficient documentation

## 2023-01-12 DIAGNOSIS — Z79899 Other long term (current) drug therapy: Secondary | ICD-10-CM | POA: Diagnosis not present

## 2023-01-12 DIAGNOSIS — G4733 Obstructive sleep apnea (adult) (pediatric): Secondary | ICD-10-CM | POA: Insufficient documentation

## 2023-01-12 LAB — BRAIN NATRIURETIC PEPTIDE: B Natriuretic Peptide: 13.2 pg/mL (ref 0.0–100.0)

## 2023-01-12 LAB — BASIC METABOLIC PANEL
Anion gap: 7 (ref 5–15)
BUN: 18 mg/dL (ref 6–20)
CO2: 26 mmol/L (ref 22–32)
Calcium: 9.7 mg/dL (ref 8.9–10.3)
Chloride: 106 mmol/L (ref 98–111)
Creatinine, Ser: 0.79 mg/dL (ref 0.44–1.00)
GFR, Estimated: >60 mL/min (ref >60)
Glucose, Bld: 128 mg/dL — ABNORMAL HIGH (ref 70–99)
Potassium: 4.3 mmol/L (ref 3.5–5.1)
Sodium: 139 mmol/L (ref 135–145)

## 2023-01-12 MED ORDER — ENTRESTO 97-103 MG PO TABS: 1.0000 | ORAL_TABLET | Freq: Two times a day (BID) | ORAL | 6 refills | Status: DC

## 2023-01-12 NOTE — Patient Instructions (Signed)
Medication Changes:  Increase Entresto to 97/103 mg Twice daily   Lab Work:  Labs done today, we will call you for abnormal results  Your physician recommends that you return for lab work in: 2 weeks  Testing/Procedures:  Your physician has requested that you have an echocardiogram. Echocardiography is a painless test that uses sound waves to create images of your heart. It provides your doctor with information about the size and shape of your heart and how well your heart's chambers and valves are working. This procedure takes approximately one hour. There are no restrictions for this procedure. Please do NOT wear cologne, perfume, aftershave, or lotions (deodorant is allowed). Please arrive 15 minutes prior to your appointment time.   Referrals:  You have been referred to follow-up with Dr Mayford Knife for CPAP, her office will contact you  Special Instructions // Education:  Do the following things EVERYDAY: Weigh yourself in the morning before breakfast. Write it down and keep it in a log. Take your medicines as prescribed Eat low salt foods--Limit salt (sodium) to 2000 mg per day.  Stay as active as you can everyday Limit all fluids for the day to less than 2 liters   Follow-Up in: 2 months  At the [facility], you and your health needs are our priority. We have a designated team specialized in the treatment of Heart Failure. This Care Team includes your primary Heart Failure Specialized Cardiologist (physician), Advanced Practice Providers (APPs- Physician Assistants and Nurse Practitioners), and Pharmacist who all work together to provide you with the care you need, when you need it.   You may see any of the following providers on your designated Care Team at your next follow up:  Dr. Arvilla Meres Dr. Marca Ancona Dr. Marcos Eke, NP Robbie Lis, [place] [facility] [place], [place] Brynda Peon, NP Karle Plumber,  PharmD   Please be sure to bring in all your medications bottles to every appointment.   Need to Contact us:  If you have any questions or concerns before your next appointment please send [place] a message through [place] or call our office at [phone].    TO LEAVE A MESSAGE FOR THE NURSE SELECT OPTION 2, PLEASE LEAVE A MESSAGE INCLUDING: YOUR NAME DATE OF BIRTH CALL BACK NUMBER REASON FOR CALL**this is important as we prioritize the call backs  YOU WILL RECEIVE A CALL BACK THE SAME DAY AS LONG AS YOU CALL BEFORE 4:00 PM

## 2023-01-13 NOTE — Progress Notes (Signed)
Primary Care: Georganna Skeans, MD HF Cardiologist: Dr. Shirlee Latch  HPI: Referred to clinic by Dr. Ella Jubilee with [facility] for heart failure consultation. 57 y.o. female with history of GERD, GI bleed d/t gastric ulcer 06/21, obesity, LBBB noted on prior ECGs. She is a Scientist, product/process development.   Admitted 10/09/21 with new systolic CHF. Sinus tachycardia noted with rates in 120s, apparently had been tachycardic for some time.  Echo with EF 20-25%, RV okay, mild MR, LAE, moderate left pleural effusion. Diuresed with IV lasix. R/LHC with no significant CAD, CO 11/CI 6, RA 1, PCWP 7 mmHg, LVEDP 11.  She was initiated on GDMT with Toprol XL 25 mg daily, spiro 12.5 mg daily, Farxiga 10 mg daily, ivabradine 5 mg BID, furosemide 20 mg daily.   Cardiac MRI in 5/23 showed LV EF 30%, severe LV dilation, normal RV with EF 53%, no LGE, mild-moderate MR.    Denies any family hx of CHF.  No alcohol or drug use. Denies any known viral illness prior to admission.   Has her own business cleaning homes.   Echo in 9/23 showed EF 30-35%, normal RV, IVC normal.   Patient returns for followup of CHF.  She has sleep apnea but on her former insurance was unable to afford CPAP.  She is feeling good overall, weight stable.  No significant exertional dyspnea or chest pain. No orthopnea/PND.  No lightheadedness. She has not used Lasix.    ECG (personally reviewed): NSR, LBBB 128 msec  Labs (4/23): K 4, creatinine 0.78, BNP 135 Labs (7/23): K 4.6, creatinine 0.73 Labs (9/23): K 4.1, creatinine 0.64, BNP 37  Review of Systems: All systems reviewed and negative except as per HPI.   PMH: 1. Upper GI bleeding: Gastric ulcer 6/21.  2. LBBB 3. OSA: Severe on sleep study.  4. Chronic systolic CHF: Nonischemic cardiomyopathy.   - Echo (3/23): EF 20-25%, normal RV, mild MR.  - RHC/LHC (3/23): No significant CAD; RA 1, mean PCWP 7, CI 6 - Cardiac MRI (5/23): LV EF 30%, severe LV dilation, normal RV with EF 53%, no LGE,  mild-moderate MR.  - Echo (9/23): EF 30-35%, normal RV, IVC normal.   Current Outpatient Medications  Medication Sig Dispense Refill   dapagliflozin propanediol (FARXIGA) 10 MG TABS tablet Take 1 tablet by mouth once daily 30 tablet 11   fluticasone (VERAMYST) 27.5 MCG/SPRAY nasal spray Place 2 sprays into the nose daily as needed.     furosemide (LASIX) 20 MG tablet Take 1 tablet (20 mg total) by mouth daily as needed for edema or fluid. 30 tablet 6   metoprolol succinate (TOPROL-XL) 50 MG 24 hr tablet Take 1 tablet (50 mg total) by mouth 2 (two) times daily. Take with or immediately following a meal. 180 tablet 3   potassium chloride (KLOR-CON M) 10 MEQ tablet Take 1 tablet (10 mEq total) by mouth daily as needed. Take 1 tablet (10 mEq total) by mouth when taking furosemide. 30 tablet 6   sacubitril-valsartan (ENTRESTO) 97-103 MG Take 1 tablet by mouth 2 (two) times daily. 60 tablet 6   spironolactone (ALDACTONE) 25 MG tablet Take 1 tablet (25 mg total) by mouth daily. 90 tablet 3   No current facility-administered medications for this encounter.    No Known Allergies    Social History   Socioeconomic History   Marital status: Married    Spouse name: Alecia Lemming   Number of children: 3   Years of education: Not on file  Highest education level: High school graduate  Occupational History   Occupation: housekeeper   Occupation: English as a second language teacher.  Tobacco Use   Smoking status: Never   Smokeless tobacco: Never  Vaping Use   Vaping Use: Never used  Substance and Sexual Activity   Alcohol use: Never   Drug use: Never   Sexual activity: Yes  Other Topics Concern   Not on file  Social History Narrative   Not on file   Social Determinants of Health   Financial Resource Strain: Low Risk  (10/11/2021)   Overall Financial Resource Strain (CARDIA)    Difficulty of Paying Living Expenses: Not very hard  Food Insecurity: Food Insecurity Present (10/11/2021)   Hunger Vital Sign     Worried About Running Out of Food in the Last Year: Sometimes true    Ran Out of Food in the Last Year: Never true  Transportation Needs: No Transportation Needs (10/11/2021)   PRAPARE - Administrator, Civil Service (Medical): No    Lack of Transportation (Non-Medical): No  Physical Activity: Not on file  Stress: Not on file  Social Connections: Not on file  Intimate Partner Violence: Not on file      Family History  Problem Relation Age of Onset   Hypertension Father     Vitals:   01/12/23 1524  BP: 108/78  Pulse: 86  SpO2: 96%  Weight: 79.7 kg (175 lb 12.8 oz)    PHYSICAL EXAM: General: NAD Neck: No JVD, no thyromegaly or thyroid nodule.  Lungs: Clear to auscultation bilaterally with normal respiratory effort. CV: Nondisplaced PMI.  Heart regular S1/S2, no S3/S4, no murmur.  No peripheral edema.  No carotid bruit.  Normal pedal pulses.  Abdomen: Soft, nontender, no hepatosplenomegaly, no distention.  Skin: Intact without lesions or rashes.  Neurologic: Alert and oriented x 3.  Psych: Normal affect. Extremities: No clubbing or cyanosis.  HEENT: Normal.   ASSESSMENT & PLAN: 1. Chronic systolic CHF: Nonischemic cardiomyopathy.  Diagnosed in 3/23, echo showed EF 20-25% with normal RV.  LHC showed no significant CAD.  Patient has been noted to have LBBB.  Cardiac MRI in 5/23 showed LV EF 30%, severe LV dilation, normal RV with EF 53%, no LGE, mild-moderate MR.   Echo 9/23 showed EF 30-35%, normal RV, IVC normal.  Etiology not certain. No CAD; ? LBBB cardiomyopathy though QRS is NOT markedly wide (128 msec today); no family hx CHF or alcohol/drug use. No evidence for infiltrative disease or myocarditis on MRI. She is not volume overloaded on exam, NYHA class I symptoms.  - She does not need a loop diuretic.  - Continue Toprol XL 50 mg bid.  - Continue Farxiga 10 mg daily - Continue spironolactone 25 mg daily.   - Increase Entresto to 97/103 bid, BMET/BNP today and  BMET in 10 days.  - EF improved some on last echo in 9/23 but still < 35%.  She has a nonischemic cardiomyopathy and no LGE on MRI. She was reluctant to have an ICD placed and QRS does not seem wide enough to predict significant CRT benefit.  Therefore, thought reasonable to repeat echo at 6 months to look for further improvement before placing ICD => will order echo today.  2. OSA: Unable to afford CPAP on old insurance, now has new insurance.  Will refer to sleep medicine to see if it is now feasible for her to get CPAP.   3. LBBB: Chronic, noted on prior ECGs.  However, not markedly  wide with QRS 128 msec.  This does not predict benefit from CRT.   Followup 2 months with APP after echo.    Marca Ancona 01/13/2023

## 2023-01-20 ENCOUNTER — Telehealth (HOSPITAL_COMMUNITY): Payer: Self-pay | Admitting: Pharmacy Technician

## 2023-01-20 ENCOUNTER — Other Ambulatory Visit (HOSPITAL_COMMUNITY): Payer: Self-pay

## 2023-01-20 NOTE — Telephone Encounter (Signed)
Medication Samples have been provided to the patient.  Drug name: Sherryll Burger       Strength: 49-51mg         Qty: 2 bottles  LOT: [id number]  Exp.Date: 12/2023  Dosing instructions: Take 2 tablets by mouth twice daily.  The patient has been instructed regarding the correct time, dose, and frequency of taking this medication, including desired effects and most common side effects.   Allen Kell [facility] 3:41 PM 01/20/2023

## 2023-01-20 NOTE — Telephone Encounter (Signed)
Advanced Heart Failure Patient Advocate Encounter  Received message that patient needs help with [facility] co-pay. States that it's not $10 like it was previously. Called [employer] to make sure co-pay card was applied. Representative stated that with the co-pay card, co-pay is ~$500.  Called and spoke with the patient's husband. Will need to seek Entresto assistance at this time. Sent docusign link. They are aware samples are waiting for pick up and we need POI in order to gain approval.

## 2023-01-21 NOTE — Telephone Encounter (Addendum)
Advanced Heart Failure Patient Advocate Encounter  Sent in application via fax. Document scanned to chart.   Will follow up. 

## 2023-01-26 NOTE — Telephone Encounter (Signed)
Advanced Heart Failure Patient Advocate Encounter  Patient was approved to receive Entresto from [employer] Effective 01/23/2023 to 07/21/2023.  Determination letter has been added to patient chart.  Patient will need to confirm address and information for first shipment when [employer] calls, or by contacting [employer] at [phone].  Attempted to contact patient by phone, no answer, voice mail box is full.   Burnell Blanks, CPhT Rx Patient Advocate Phone: [phone]

## 2023-01-28 NOTE — Telephone Encounter (Signed)
Advanced Heart Failure Patient Advocate Encounter  Spoke to patient, confirmed approval

## 2023-01-29 ENCOUNTER — Encounter (HOSPITAL_COMMUNITY): Payer: Commercial Managed Care - HMO

## 2023-01-29 ENCOUNTER — Ambulatory Visit (HOSPITAL_COMMUNITY)
Admission: RE | Admit: 2023-01-29 | Discharge: 2023-01-29 | Disposition: A | Discharge status: Home / Self Care | Payer: Commercial Managed Care - HMO | Source: Ambulatory Visit | Attending: Internal Medicine | Admitting: Internal Medicine

## 2023-01-29 ENCOUNTER — Ambulatory Visit (HOSPITAL_COMMUNITY)
Admission: RE | Admit: 2023-01-29 | Discharge: 2023-01-29 | Disposition: A | Discharge status: Home / Self Care | Payer: Commercial Managed Care - HMO | Source: Ambulatory Visit | Attending: Family Medicine | Admitting: Family Medicine

## 2023-01-29 DIAGNOSIS — I5022 Chronic systolic (congestive) heart failure: Secondary | ICD-10-CM | POA: Diagnosis not present

## 2023-01-29 DIAGNOSIS — G4730 Sleep apnea, unspecified: Secondary | ICD-10-CM | POA: Diagnosis not present

## 2023-01-29 DIAGNOSIS — I509 Heart failure, unspecified: Secondary | ICD-10-CM | POA: Insufficient documentation

## 2023-01-29 LAB — BASIC METABOLIC PANEL
Anion gap: 8 (ref 5–15)
BUN: 12 mg/dL (ref 6–20)
CO2: 26 mmol/L (ref 22–32)
Calcium: 9.3 mg/dL (ref 8.9–10.3)
Chloride: 106 mmol/L (ref 98–111)
Creatinine, Ser: 0.83 mg/dL (ref 0.44–1.00)
GFR, Estimated: >60 mL/min (ref >60)
Glucose, Bld: 112 mg/dL — ABNORMAL HIGH (ref 70–99)
Potassium: 4 mmol/L (ref 3.5–5.1)
Sodium: 140 mmol/L (ref 135–145)

## 2023-01-29 LAB — ECHOCARDIOGRAM COMPLETE
Area-P 1/2: 1.98 cm2
Calc EF: 39 %
MV VTI: 3.59 cm2
S' Lateral: 5.1 cm
Single Plane A2C EF: 39.4 %
Single Plane A4C EF: 37.9 %

## 2023-01-29 NOTE — Progress Notes (Signed)
  Echocardiogram 2D Echocardiogram has been performed.  Lori Mcbride 01/29/2023, 12:11 PM

## 2023-02-04 ENCOUNTER — Telehealth (HOSPITAL_COMMUNITY): Payer: Self-pay | Admitting: *Deleted

## 2023-02-04 DIAGNOSIS — I5022 Chronic systolic (congestive) heart failure: Secondary | ICD-10-CM

## 2023-02-04 NOTE — Telephone Encounter (Signed)
Called patient's daughter per Dr. Shirlee Latch with following results and instructions:  "EF remains low at 30-35%, she has dyssynchrony on echo that suggests possible benefit from CRT but QRS was not wide enough on last ECG to predict benefit.  Would recommend EP referral to discuss ICD and also for evaluation on feasibility of CRT."  Daughter agrees to referral to EP and asked that EP call either herself or her father to schedule. Will add to scheduling instructions in referral order. Asked daughter to call us at [phone] if any questions or concerns. She verbalized agreement to same.

## 2023-03-02 ENCOUNTER — Other Ambulatory Visit (HOSPITAL_COMMUNITY): Payer: Self-pay | Admitting: Cardiology

## 2023-03-10 NOTE — Progress Notes (Signed)
PCP Georganna Skeans, MD HF Cardiologist: Dr. Shirlee Latch  HPI: 57 y.o. female with history of GERD, GI bleed d/t gastric ulcer 06/21, obesity, LBBB noted on prior ECGs. She is a Scientist, product/process development.   Admitted 3/23 with new systolic CHF. Sinus tachycardia noted with rates in 120s, apparently had been tachycardic for some time.  Echo showed EF 20-25%, RV okay, mild MR, LAE, moderate left pleural effusion. Diuresed with IV lasix. R/LHC with no significant CAD, CO 11/CI 6, RA 1, PCWP 7 mmHg, LVEDP 11.  She was initiated on GDMT; Toprol XL 25 mg daily, spiro 12.5 mg daily, Farxiga 10 mg daily, ivabradine 5 mg BID, furosemide 20 mg daily.   Cardiac MRI in 5/23 showed LV EF 30%, severe LV dilation, normal RV with EF 53%, no LGE, mild-moderate MR.    Denies any family hx of CHF.  No alcohol or drug use. Denies any known viral illness prior to admission.   Has her own business cleaning homes.   Echo in 9/23 showed EF 30-35%, normal RV, IVC normal.   Echo 7/24 showed EF 30-35% with lateral-septal dyssynchrony consistent with LBBB, normal RV. Referred to EP to discuss CRT.  Today she returns for HF follow up with her husband. Overall feeling fine. She feels fatigued after a long days at work but otherwise no issues. 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 Denies palpitations, CP, dizziness, edema, or PND/Orthopnea. Appetite ok. No fever or chills. Weight at home 172-173 pounds. Taking all medications. Unable to afford deposit for CPAP. She has seen EP and has declined CRT.  ECG (personally reviewed): none ordered today.  Labs (4/23): K 4, creatinine 0.78, BNP 135 Labs (7/23): K 4.6, creatinine 0.73 Labs (9/23): K 4.1, creatinine 0.64, BNP 37 Labs (7/24): K 4.0, creatinine 0.83  Review of Systems: All systems reviewed and negative except as per HPI.   PMH: 1. Upper GI bleeding: Gastric ulcer 6/21.  2. LBBB 3. OSA: Severe on sleep study.  4. Chronic systolic  CHF: Nonischemic cardiomyopathy.   - Echo (3/23): EF 20-25%, normal RV, mild MR.  - RHC/LHC (3/23): No significant CAD; RA 1, mean PCWP 7, CI 6 - Cardiac MRI (5/23): LV EF 30%, severe LV dilation, normal RV with EF 53%, no LGE, mild-moderate MR.  - Echo (9/23): EF 30-35%, normal RV, IVC normal. - Echo (7/24): EF 30-35% with lateral-septal dyssynchrony consistent with LBBB, normal RV   Current Outpatient Medications  Medication Sig Dispense Refill   dapagliflozin propanediol (FARXIGA) 10 MG TABS tablet Take 1 tablet by mouth once daily 30 tablet 11   furosemide (LASIX) 20 MG tablet Take 1 tablet (20 mg total) by mouth daily as needed for edema or fluid. 30 tablet 6   metoprolol succinate (TOPROL-XL) 50 MG 24 hr tablet Take 1 tablet (50 mg total) by mouth 2 (two) times daily. Take with or immediately following a meal. 180 tablet 3   potassium chloride (KLOR-CON M) 10 MEQ tablet Take 1 tablet (10 mEq total) by mouth daily as needed. Take 1 tablet (10 mEq total) by mouth when taking furosemide. 30 tablet 6   sacubitril-valsartan (ENTRESTO) 97-103 MG Take 1 tablet by mouth 2 (two) times daily. 60 tablet 6   spironolactone (ALDACTONE) 25 MG tablet Take 1 tablet by mouth once daily 90 tablet 1   No current facility-administered medications for this encounter.    No Known Allergies    Social History  Socioeconomic History   Marital status: Married    Spouse name: Alecia Lemming   Number of children: 3   Years of education: Not on file   Highest education level: High school graduate  Occupational History   Occupation: housekeeper   Occupation: English as a second language teacher.  Tobacco Use   Smoking status: Never   Smokeless tobacco: Never  Vaping Use   Vaping status: Never Used  Substance and Sexual Activity   Alcohol use: Never   Drug use: Never   Sexual activity: Yes  Other Topics Concern   Not on file  Social History Narrative   Not on file   Social Determinants of Health   Financial Resource  Strain: Low Risk  (10/11/2021)   Overall Financial Resource Strain (CARDIA)    Difficulty of Paying Living Expenses: Not very hard  Food Insecurity: Food Insecurity Present (10/11/2021)   Hunger Vital Sign    Worried About Running Out of Food in the Last Year: Sometimes true    Ran Out of Food in the Last Year: Never true  Transportation Needs: No Transportation Needs (10/11/2021)   PRAPARE - Administrator, Civil Service (Medical): No    Lack of Transportation (Non-Medical): No  Physical Activity: Not on file  Stress: Not on file  Social Connections: Unknown (12/03/2021)   [facility]   Social Network    Social Network: Not on file  Intimate Partner Violence: Unknown (10/25/2021)   Received from [facility]   HITS    Physically Hurt: Not on file    Insult or Talk Down To: Not on file    Threaten Physical Harm: Not on file    Scream or Curse: Not on file      Family History  Problem Relation Age of Onset   Hypertension Father    BP 122/80   Pulse 75   Wt 79.7 kg (175 lb 12.8 oz)   SpO2 97%   BMI 30.18 kg/m   Wt Readings from Last 3 Encounters:  03/16/23 79.7 kg (175 lb 12.8 oz)  03/12/23 80.5 kg (177 lb 6.4 oz)  01/12/23 79.7 kg (175 lb 12.8 oz)   PHYSICAL EXAM: General:  NAD. No resp difficulty HEENT: Normal Neck: Supple. No JVD. Carotids 2+ bilat; no bruits. No lymphadenopathy or thryomegaly appreciated. Cor: PMI nondisplaced. Regular rate & rhythm. No rubs, gallops or murmurs. Lungs: Clear Abdomen: Soft, nontender, nondistended. No hepatosplenomegaly. No bruits or masses. Good bowel sounds. Extremities: No cyanosis, clubbing, rash, edema Neuro: Alert & oriented x 3, cranial nerves grossly intact. Moves all 4 extremities w/o difficulty. Affect pleasant.  ASSESSMENT & PLAN: 1. Chronic systolic CHF: Nonischemic cardiomyopathy.  Diagnosed in 3/23, echo showed EF 20-25% with normal RV.  LHC showed no significant CAD.  Patient has been noted  to have LBBB.  Cardiac MRI in 5/23 showed LV EF 30%, severe LV dilation, normal RV with EF 53%, no LGE, mild-moderate MR.   Echo 9/23 showed EF 30-35%, normal RV, IVC normal.  Echo 7/24 showed EF 30-35% with lateral-septal dyssynchrony consistent with LBBB, normal RV. Etiology not certain. No CAD; ? LBBB cardiomyopathy though QRS is NOT markedly wide (128 msec); no family hx CHF or alcohol/drug use. No evidence for infiltrative disease or myocarditis on MRI. She is not volume overloaded on exam, NYHA class I symptoms.  - She does not need a loop diuretic.  - Continue Toprol XL 50 mg bid.  - Continue Farxiga 10 mg daily - Continue spironolactone 25  mg daily.  Recent labs reviewed and are stable - Continue Entresto 97/103 bid. - EF remains < 35%. She has seen EP and has declined CRT. 2. OSA: Unable to afford CPAP on old insurance, now has new insurance. She has been referred to sleep medicine to see if it is now feasible for her to get CPAP.  [employer] helping with deposit. 3. LBBB: Chronic, noted on prior ECGs.  However, not markedly wide with QRS 138 msec on most recent ECG.  - As above, has seen EP and offered CRT. She has declined.  Follow up in 3 months with Dr. Shirlee Latch.  Anderson Malta [facility] FNP-BC 03/16/2023

## 2023-03-12 ENCOUNTER — Ambulatory Visit: Payer: Commercial Managed Care - HMO | Admitting: Cardiology

## 2023-03-12 ENCOUNTER — Encounter: Payer: Self-pay | Admitting: Cardiology

## 2023-03-12 VITALS — BP 118/74 | HR 70 | Ht 64.0 in | Wt 177.4 lb

## 2023-03-12 DIAGNOSIS — I428 Other cardiomyopathies: Secondary | ICD-10-CM

## 2023-03-12 DIAGNOSIS — I447 Left bundle-branch block, unspecified: Secondary | ICD-10-CM

## 2023-03-12 DIAGNOSIS — I5022 Chronic systolic (congestive) heart failure: Secondary | ICD-10-CM | POA: Diagnosis not present

## 2023-03-12 NOTE — Patient Instructions (Addendum)
Medication Instructions:  Your physician recommends that you continue on your current medications as directed. Please refer to the Current Medication list given to you today.  *If you need a refill on your cardiac medications before your next appointment, please call your pharmacy*   Lab Work: None ordered    Testing/Procedures: CRT or cardiac resynchronization therapy is a treatment used to correct heart failure. When you have heart failure your heart is weakened and doesn't pump as well as it should. This therapy may help reduce symptoms and improve the quality of life.  Please see the handout/brochure given to you today to get more information of the different options of therapy.  Please call the office and let us know your decision.   Follow-Up: At [facility], you and your health needs are our priority.  As part of our continuing mission to provide you with exceptional heart care, we have created designated Provider Care Teams.  These Care Teams include your primary Cardiologist (physician) and Advanced Practice Providers (APPs -  Physician Assistants and Nurse Practitioners) who all work together to provide you with the care you need, when you need it.  Your next appointment:   to  be determined  The format for your next appointment:   In Person  Provider:   Loman Brooklyn, MD    Thank you for choosing [facility]!!   Dory Horn, RN [phone]  Other Instructions  Cardiac resynchronization therapy Cardiac resynchronization therapy is a treatment for some people who have heart failure. It helps the heart chambers squeeze in a better and more organized way. CRT helps the heart better pump blood to the body.  Cardiac resynchronization therapy (CRT) uses a device called a biventricular pacemaker, also called a cardiac resynchronization device. The device sends electrical signals to the lower chambers of the heart, called the ventricles. The signals tell the lower  chambers to squeeze at the same time.  Some CRT devices also have a device called an implantable cardioverter-defibrillator (ICD). An ICD corrects a dangerous heart rhythm.  Why it's done Cardiac resynchronization therapy (CRT) is a treatment for some people with moderate to severe heart failure. Other treatments, including medicines, are usually tried before CRT is recommended.  Your healthcare team may suggest CRT if: You have heart failure and an irregular heart signaling condition such as left bundle branch block. You need a pacemaker or defibrillator due to a heart condition. Cardiac resynchronization therapy may help reduce symptoms of heart failure such as shortness of breath. When combined with other treatments, CRT may:  Improve quality of life. Reduce the risk of a hospital stay due to heart failure. Help prevent heart failure complications. CRT is not for people with only mild heart failure symptoms.  Risks Risks of cardiac resynchronization therapy may depend on the type of device used and your overall health.  Risks related to the device and surgery to place the device may include: Infection. Bleeding. Collapsed lung. A hole in a heart chamber, causing fluid and blood to collect in the sac surrounding the heart. Failure of the device. Movement of device parts, which could require another procedure to correct.

## 2023-03-12 NOTE — Progress Notes (Signed)
  Electrophysiology Office Note:   Date:  03/12/2023  ID:  Lori Mcbride, [place] Sep 23, 1965, MRN [id number]  Primary Cardiologist: Donato Schultz, MD Electrophysiologist: None      History of Present Illness:   Lori Mcbride is a 57 y.o. female with h/o chronic systolic heart failure seen today for  for Electrophysiology evaluation of chronic systolic heart failure, left bundle branch block at the request of Lori Mcbride.   She overall feels well.  She has no chest pain and only mild shortness of breath.  She states that on days that she does not work, she has no complaints.  On days that she does work, 6 to 8 hours, doing housecleaning and yard work, she can be quite fatigued for a few days after.    She was admitted to the hospital 10/09/2021 with new onset heart failure.  Ejection fraction at the time was 20 to 25%.  She had cardiac MRI that showed an EF of 30% with severe LV dilation and no LGE.  Repeat echo shows an ejection fraction of 30 to 35%.  She has left bundle branch block with a QRS of 138 ms and dyssynchrony noted on echo.     Review of systems complete and found to be negative unless listed in HPI.   EP Information / Studies Reviewed:    EKG is ordered today. Personal review as below.  EKG Interpretation Date/Time:  Thursday March 12 2023 10:01:23 EDT Ventricular Rate:  70 PR Interval:  162 QRS Duration:  138 QT Interval:  426 QTC Calculation: 460 R Axis:   35  Text Interpretation: Normal sinus rhythm Left bundle branch block Cornell product ) When compared with ECG of 12-Jan-2023 15:29, No significant change since last tracing Confirmed by Sadat Sliwa ([id number]) on 03/12/2023 10:13:17 AM     Risk Assessment/Calculations:              Physical Exam:   VS:  BP 118/74 (BP Location: Left Arm, Patient Position: Sitting, Cuff Size: Large)   Pulse 70   Ht 5\' 4"  (1.626 m)   Wt 177 lb 6.4 oz (80.5 kg)   SpO2 98%   BMI 30.45 kg/m    Wt Readings from Last 3  Encounters:  03/12/23 177 lb 6.4 oz (80.5 kg)  01/12/23 175 lb 12.8 oz (79.7 kg)  06/10/22 173 lb (78.5 kg)     GEN: Well nourished, well developed in no acute distress NECK: No JVD; No carotid bruits CARDIAC: Regular rate and rhythm, no murmurs, rubs, gallops RESPIRATORY:  Clear to auscultation without rales, wheezing or rhonchi  ABDOMEN: Soft, non-tender, non-distended EXTREMITIES:  No edema; No deformity   ASSESSMENT AND PLAN:    1.  Chronic systolic heart failure: Due to ischemic cardiomyopathy.  Currently on optimal medical therapy per primary cardiology.  Based on her most recent echo, she would benefit from ICD therapy.  She has a left bundle branch block and would also benefit from CRT due to the synchrony noted on echo.  We have given her information on the device.  She would like discussed this further with her family and Rainee Sweatt let us know if she wishes to proceed.  Kateland Leisinger discuss this further with her primary cardiologist.  2.  Obstructive sleep apnea: Has been referred to sleep medicine  Follow up with Dr. Elberta Fortis  pending decision on ICD   Signed, Tocarra Gassen Jorja Loa, MD

## 2023-03-13 ENCOUNTER — Encounter (HOSPITAL_COMMUNITY): Payer: Commercial Managed Care - HMO

## 2023-03-16 ENCOUNTER — Telehealth (HOSPITAL_COMMUNITY): Payer: Self-pay | Admitting: Licensed Clinical Social Worker

## 2023-03-16 ENCOUNTER — Ambulatory Visit (HOSPITAL_COMMUNITY)
Admission: RE | Admit: 2023-03-16 | Discharge: 2023-03-16 | Disposition: A | Discharge status: Home / Self Care | Payer: Commercial Managed Care - HMO | Source: Ambulatory Visit | Attending: Family Medicine | Admitting: Family Medicine

## 2023-03-16 ENCOUNTER — Encounter (HOSPITAL_COMMUNITY): Payer: Self-pay

## 2023-03-16 VITALS — BP 122/80 | HR 75 | Wt 175.8 lb

## 2023-03-16 DIAGNOSIS — I5022 Chronic systolic (congestive) heart failure: Secondary | ICD-10-CM

## 2023-03-16 DIAGNOSIS — I447 Left bundle-branch block, unspecified: Secondary | ICD-10-CM

## 2023-03-16 DIAGNOSIS — G4733 Obstructive sleep apnea (adult) (pediatric): Secondary | ICD-10-CM

## 2023-03-16 DIAGNOSIS — I428 Other cardiomyopathies: Secondary | ICD-10-CM | POA: Insufficient documentation

## 2023-03-16 NOTE — Patient Instructions (Addendum)
Medication Changes:  No Changes In Medications at this time.   Lab Work:  None Ordered At This Time.   Special Instructions // Education:  PLEASE SCHEDULE APPOINTMENT WITH DR. Mayford Knife- CAN CALL TO SCHEDULE [phone]  Follow-Up in: 3 MONTHS WITH DR. Shirlee Latch AS SCHEDULED   At the [facility], you and your health needs are our priority. We have a designated team specialized in the treatment of Heart Failure. This Care Team includes your primary Heart Failure Specialized Cardiologist (physician), Advanced Practice Providers (APPs- Physician Assistants and Nurse Practitioners), and Pharmacist who all work together to provide you with the care you need, when you need it.   You may see any of the following providers on your designated Care Team at your next follow up:  Dr. Arvilla Meres Dr. Marca Ancona Dr. Marcos Eke, NP Robbie Lis, [place] [facility] [place], [place] Brynda Peon, NP Karle Plumber, PharmD   Please be sure to bring in all your medications bottles to every appointment.   Need to Contact us:  If you have any questions or concerns before your next appointment please send [place] a message through [place] or call our office at [phone].    TO LEAVE A MESSAGE FOR THE NURSE SELECT OPTION 2, PLEASE LEAVE A MESSAGE INCLUDING: YOUR NAME DATE OF BIRTH CALL BACK NUMBER REASON FOR CALL**this is important as we prioritize the call backs  YOU WILL RECEIVE A CALL BACK THE SAME DAY AS LONG AS YOU CALL BEFORE 4:00 PM

## 2023-03-16 NOTE — Telephone Encounter (Signed)
H&V Care Navigation CSW Progress Note  Clinical Social Worker consulted to help pt figure out paying for deposit for CPAP.  CSW spoke with pt and pt spouse and informed of requirements to move forward with assistance- proof of income and grant acknowledgement form signed.  Pt CPAP last ordered back in January with [facility]- CSW called them and confirmed that order could be restarted and payment could be made for deposit.  State that deposit is $313.67 and ongoing payments would be 65.83.   SDOH Screenings   Food Insecurity: Food Insecurity Present (10/11/2021)  Housing: Low Risk  (10/11/2021)  Transportation Needs: No Transportation Needs (10/11/2021)  Alcohol Screen: Low Risk  (10/11/2021)  Depression (PHQ2-9): Low Risk  (10/15/2021)  Financial Resource Strain: Low Risk  (10/11/2021)  Social Connections: Unknown (12/03/2021)   Received from [facility]  Tobacco Use: Low Risk  (03/16/2023)    Burna Sis, LCSW Clinical Social Worker [facility] Desk#: [phone] Cell#: [phone]

## 2023-03-20 NOTE — Telephone Encounter (Signed)
Entered in error

## 2023-03-30 ENCOUNTER — Telehealth: Payer: Self-pay

## 2023-03-30 NOTE — Telephone Encounter (Signed)
Call to patient as they are scheduled for 04/01/23 appt w/ Dr. Mayford Knife for sleep medicine. After reviewing notes, it appears patient does not have cpap at this time. Patient verifies this and states they would like to reschedule this week's appt as they may have cpap machine soon and would prefer to be seen when they have it. Patient states they do not want to pay for this visit until they have cpap machine and can get help/advise about  how to use it. Appointment canceled, gave callback # and advised patient to call us once they get cpap machine so we can schedule them for visit.

## 2023-04-01 ENCOUNTER — Telehealth (HOSPITAL_COMMUNITY): Payer: Self-pay | Admitting: Licensed Clinical Social Worker

## 2023-04-01 ENCOUNTER — Ambulatory Visit: Payer: Commercial Managed Care - HMO | Admitting: Cardiology

## 2023-04-01 NOTE — Telephone Encounter (Signed)
H&V Care Navigation CSW Progress Note  Clinical Social Worker received all necessary documents to assist with patients water bills.  Patient unable to pay water bills as she has to pay for copay for CPAP this month.  Check request submitted and awaiting review and approval.   SDOH Screenings   Food Insecurity: Food Insecurity Present (10/11/2021)  Housing: Low Risk  (10/11/2021)  Transportation Needs: No Transportation Needs (10/11/2021)  Alcohol Screen: Low Risk  (10/11/2021)  Depression (PHQ2-9): Low Risk  (10/15/2021)  Financial Resource Strain: Low Risk  (10/11/2021)  Social Connections: Unknown (12/03/2021)   Received from [facility]  Tobacco Use: Low Risk  (03/16/2023)   Burna Sis, LCSW Clinical Social Worker [facility] Desk#: [phone] Cell#: [phone]

## 2023-04-17 ENCOUNTER — Telehealth (HOSPITAL_COMMUNITY): Payer: Self-pay | Admitting: Licensed Clinical Social Worker

## 2023-04-17 NOTE — Telephone Encounter (Signed)
H&V Care Navigation CSW Progress Note  Clinical Social Worker received call from pt requesting help with getting CPAP.  Pt and spouse called [facility] to order and were told it has been too long since sleep study and that a new one would need to be done.  CSW called [facility] to clarify.  They report the only sleep study they got was from May 2023- CSW informed of November 2023 sleep study and they report this would allow them to move forward in ordering.  Sleep study and subsequent MD note faxed to [facility]- fax confirmation received.   SDOH Screenings   Food Insecurity: Food Insecurity Present (10/11/2021)  Housing: Low Risk  (10/11/2021)  Transportation Needs: No Transportation Needs (10/11/2021)  Alcohol Screen: Low Risk  (10/11/2021)  Depression (PHQ2-9): Low Risk  (10/15/2021)  Financial Resource Strain: Medium Risk (04/01/2023)  Social Connections: Unknown (12/03/2021)   Received from [facility], [facility]  Tobacco Use: Low Risk  (03/16/2023)   Burna Sis, LCSW Clinical Social Worker [facility] Desk#: [phone] Cell#: [phone]

## 2023-04-20 ENCOUNTER — Telehealth (HOSPITAL_COMMUNITY): Payer: Self-pay | Admitting: Licensed Clinical Social Worker

## 2023-04-20 NOTE — Telephone Encounter (Signed)
H&V Care Navigation CSW Progress Note  Clinical Social Worker called [facility] to check on CPAP order updates that CSW sent in on Friday- they confirm they have everything they need and will reach out to the patient to order once everything is processed.  CSW updated patient.   SDOH Screenings   Food Insecurity: Food Insecurity Present (10/11/2021)  Housing: Low Risk  (10/11/2021)  Transportation Needs: No Transportation Needs (10/11/2021)  Alcohol Screen: Low Risk  (10/11/2021)  Depression (PHQ2-9): Low Risk  (10/15/2021)  Financial Resource Strain: Medium Risk (04/01/2023)  Social Connections: Unknown (12/03/2021)   Received from [facility], [facility]  Tobacco Use: Low Risk  (03/16/2023)   Burna Sis, LCSW Clinical Social Worker [facility] Desk#: [phone] Cell#: [phone]

## 2023-05-04 NOTE — Telephone Encounter (Signed)
Discussed w/ J. Uris, LCSW that as of 04/20/23 [facility] confirms they have all materials needed. Attempted to call patient, no answer and unable to leave message as mailbox was full. Forwarded to sleep studies pool to find out status of cpap delivery.

## 2023-05-29 ENCOUNTER — Other Ambulatory Visit (HOSPITAL_COMMUNITY): Payer: Self-pay | Admitting: Cardiology

## 2023-06-09 ENCOUNTER — Ambulatory Visit (HOSPITAL_COMMUNITY)
Admission: RE | Admit: 2023-06-09 | Discharge: 2023-06-09 | Disposition: A | Discharge status: Home / Self Care | Payer: Managed Care, Other (non HMO) | Source: Ambulatory Visit | Attending: Cardiology | Admitting: Cardiology

## 2023-06-09 ENCOUNTER — Encounter (HOSPITAL_COMMUNITY): Payer: Self-pay | Admitting: Cardiology

## 2023-06-09 VITALS — BP 120/78 | HR 78 | Wt 175.2 lb

## 2023-06-09 DIAGNOSIS — R000 Tachycardia, unspecified: Secondary | ICD-10-CM | POA: Diagnosis present

## 2023-06-09 DIAGNOSIS — I5022 Chronic systolic (congestive) heart failure: Secondary | ICD-10-CM

## 2023-06-09 DIAGNOSIS — I447 Left bundle-branch block, unspecified: Secondary | ICD-10-CM | POA: Insufficient documentation

## 2023-06-09 DIAGNOSIS — R9431 Abnormal electrocardiogram [ECG] [EKG]: Secondary | ICD-10-CM | POA: Insufficient documentation

## 2023-06-09 DIAGNOSIS — I428 Other cardiomyopathies: Secondary | ICD-10-CM | POA: Insufficient documentation

## 2023-06-09 DIAGNOSIS — G4733 Obstructive sleep apnea (adult) (pediatric): Secondary | ICD-10-CM | POA: Insufficient documentation

## 2023-06-09 LAB — BASIC METABOLIC PANEL
Anion gap: 7 (ref 5–15)
BUN: 14 mg/dL (ref 6–20)
CO2: 27 mmol/L (ref 22–32)
Calcium: 9.7 mg/dL (ref 8.9–10.3)
Chloride: 106 mmol/L (ref 98–111)
Creatinine, Ser: 0.59 mg/dL (ref 0.44–1.00)
GFR, Estimated: >60 mL/min (ref >60)
Glucose, Bld: 100 mg/dL — ABNORMAL HIGH (ref 70–99)
Potassium: 4.1 mmol/L (ref 3.5–5.1)
Sodium: 140 mmol/L (ref 135–145)

## 2023-06-09 LAB — BRAIN NATRIURETIC PEPTIDE: B Natriuretic Peptide: 11.2 pg/mL (ref 0.0–100.0)

## 2023-06-09 MED ORDER — METOPROLOL SUCCINATE ER 50 MG PO TB24: ORAL_TABLET | ORAL | 3 refills | Status: DC

## 2023-06-09 NOTE — Progress Notes (Signed)
PCP Georganna Skeans, MD HF Cardiologist: Dr. Shirlee Latch  HPI: 57 y.o. female with history of GERD, GI bleed d/t gastric ulcer 06/21, obesity, LBBB noted on prior ECGs. She is a Scientist, product/process development.   Admitted 3/23 with new systolic CHF. Sinus tachycardia noted with rates in 120s, apparently had been tachycardic for some time.  Echo showed EF 20-25%, RV okay, mild MR, LAE, moderate left pleural effusion. Diuresed with IV lasix. R/LHC with no significant CAD, CO 11/CI 6, RA 1, PCWP 7 mmHg, LVEDP 11.  She was initiated on GDMT; Toprol XL 25 mg daily, spiro 12.5 mg daily, Farxiga 10 mg daily, ivabradine 5 mg BID, furosemide 20 mg daily.   Cardiac MRI in 5/23 showed LV EF 30%, severe LV dilation, normal RV with EF 53%, no LGE, mild-moderate MR.    Denies any family hx of CHF.  No alcohol or drug use. Denies any known viral illness prior to admission.   Has her own business cleaning homes.   Echo in 9/23 showed EF 30-35%, normal RV, IVC normal.   Echo 7/24 showed EF 30-35% with septal-lateral dyssynchrony consistent with LBBB, normal RV. Referred to EP to discuss CRT.  She saw EP but was not ready to commit to device.   Today she returns for HF follow up with her husband. She now has CPAP and is much less tired during the day.  She feels good overall, no dyspnea walking on flat ground or up a flight of stairs. Dyspnea only with heavy exertion.  No lightheadedness or palpitations.  No chest pain.  Weight stable.   ECG (personally reviewed): NSR, LBBB (134 msec)  Labs (4/23): K 4, creatinine 0.78, BNP 135 Labs (7/23): K 4.6, creatinine 0.73 Labs (9/23): K 4.1, creatinine 0.64, BNP 37 Labs (7/24): K 4.0, creatinine 0.83  Review of Systems: All systems reviewed and negative except as per HPI.   PMH: 1. Upper GI bleeding: Gastric ulcer 6/21.  2. LBBB 3. OSA: Severe on sleep study.  4. Chronic systolic CHF: Nonischemic cardiomyopathy.   - Echo (3/23): EF 20-25%, normal RV, mild MR.  - RHC/LHC  (3/23): No significant CAD; RA 1, mean PCWP 7, CI 6 - Cardiac MRI (5/23): LV EF 30%, severe LV dilation, normal RV with EF 53%, no LGE, mild-moderate MR.  - Echo (9/23): EF 30-35%, normal RV, IVC normal. - Echo (7/24): EF 30-35% with septal-lateral dyssynchrony consistent with LBBB, normal RV   Current Outpatient Medications  Medication Sig Dispense Refill   dapagliflozin propanediol (FARXIGA) 10 MG TABS tablet Take 1 tablet by mouth once daily 30 tablet 11   furosemide (LASIX) 20 MG tablet Take 1 tablet (20 mg total) by mouth daily as needed for edema or fluid. 30 tablet 6   potassium chloride (KLOR-CON M) 10 MEQ tablet Take 1 tablet (10 mEq total) by mouth daily as needed. Take 1 tablet (10 mEq total) by mouth when taking furosemide. 30 tablet 6   sacubitril-valsartan (ENTRESTO) 97-103 MG Take 1 tablet by mouth 2 (two) times daily. 60 tablet 6   spironolactone (ALDACTONE) 25 MG tablet Take 1 tablet by mouth once daily 90 tablet 1   metoprolol succinate (TOPROL-XL) 50 MG 24 hr tablet Take 2 tablets (100 mg total) by mouth in the morning AND 1 tablet (50 mg total) every evening. Take with or immediately following a meal.. 180 tablet 3   No current facility-administered medications for this encounter.    No Known Allergies  Social History   Socioeconomic History   Marital status: Married    Spouse name: Alecia Lemming   Number of children: 3   Years of education: Not on file   Highest education level: High school graduate  Occupational History   Occupation: housekeeper   Occupation: English as a second language teacher.  Tobacco Use   Smoking status: Never   Smokeless tobacco: Never  Vaping Use   Vaping status: Never Used  Substance and Sexual Activity   Alcohol use: Never   Drug use: Never   Sexual activity: Yes  Other Topics Concern   Not on file  Social History Narrative   Not on file   Social Determinants of Health   Financial Resource Strain: Medium Risk (04/01/2023)   Overall Financial  Resource Strain (CARDIA)    Difficulty of Paying Living Expenses: Somewhat hard  Food Insecurity: Food Insecurity Present (10/11/2021)   Hunger Vital Sign    Worried About Running Out of Food in the Last Year: Sometimes true    Ran Out of Food in the Last Year: Never true  Transportation Needs: No Transportation Needs (10/11/2021)   PRAPARE - Administrator, Civil Service (Medical): No    Lack of Transportation (Non-Medical): No  Physical Activity: Not on file  Stress: Not on file  Social Connections: Unknown (12/03/2021)   Received from [facility], [facility]   Social Network    Social Network: Not on file  Intimate Partner Violence: Unknown (10/25/2021)   [facility] from [facility], [facility]   HITS    Physically Hurt: Not on file    Insult or Talk Down To: Not on file    Threaten Physical Harm: Not on file    Scream or Curse: Not on file      Family History  Problem Relation Age of Onset   Hypertension Father    BP 120/78   Pulse 78   Wt 79.5 kg (175 lb 3.2 oz)   SpO2 98%   BMI 30.07 kg/m   Wt Readings from Last 3 Encounters:  06/09/23 79.5 kg (175 lb 3.2 oz)  03/16/23 79.7 kg (175 lb 12.8 oz)  03/12/23 80.5 kg (177 lb 6.4 oz)   PHYSICAL EXAM: General: NAD Neck: No JVD, no thyromegaly or thyroid nodule.  Lungs: Clear to auscultation bilaterally with normal respiratory effort. CV: Nondisplaced PMI.  Heart regular S1/S2, no S3/S4, no murmur.  No peripheral edema.  No carotid bruit.  Normal pedal pulses.  Abdomen: Soft, nontender, no hepatosplenomegaly, no distention.  Skin: Intact without lesions or rashes.  Neurologic: Alert and oriented x 3.  Psych: Normal affect. Extremities: No clubbing or cyanosis.  HEENT: Normal.   ASSESSMENT & PLAN: 1. Chronic systolic CHF: Nonischemic cardiomyopathy.  Diagnosed in 3/23, echo showed EF 20-25% with normal RV.  LHC showed no significant CAD.  Patient has been noted to have LBBB.  Cardiac MRI in 5/23 showed  LV EF 30%, severe LV dilation, normal RV with EF 53%, no LGE, mild-moderate MR.   Echo 9/23 showed EF 30-35%, normal RV, IVC normal.  Echo 7/24 showed EF 30-35% with septal-lateral dyssynchrony consistent with LBBB, normal RV. Etiology not certain. No CAD; ? LBBB cardiomyopathy though QRS is NOT markedly wide (134 msec); no family hx CHF or alcohol/drug use. No evidence for infiltrative disease or myocarditis on MRI. She is not volume overloaded on exam, NYHA class I currently.  - She does not need a loop diuretic.  - Increase Toprol XL to 100 qam/50  qpm.  - Continue Farxiga 10 mg daily - Continue spironolactone 25 mg daily.  BMET/BNP today.  - Continue Entresto 97/103 bid. - EF remains < 35%. She has seen EP, and though QRS is not markedly wide, she was offered CRT based on moderately wide LBBB with septal-lateral dyssynchrony on echo.  She is still thinking about this, and we discussed the rationale for CRT again today. She will call us if she decides she wants to have device placed.  2. OSA: Now using CPAP, feels much better.  3. LBBB: Chronic, noted on prior ECGs.  Not markedly wide with QRS 134 msec on most recent ECG.  However, prominent septal-lateral dyssynchrony and concern that she could have a LBBB cardiomyopathy.  - As above, has seen EP and offered CRT. She has so far declined.  Follow up in 3 months with APP  Marca Ancona  06/09/2023

## 2023-06-09 NOTE — Patient Instructions (Signed)
CHANGE Toprol XL to 100 mg in the morning and 50 mg in the evening.  Labs done today, your results will be available in MyChart, we will contact you for abnormal readings.  Your physician recommends that you schedule a follow-up appointment in: 3 months.  If you have any questions or concerns before your next appointment please send [place] a message through [place] or call our office at [phone].    TO LEAVE A MESSAGE FOR THE NURSE SELECT OPTION 2, PLEASE LEAVE A MESSAGE INCLUDING: YOUR NAME DATE OF BIRTH CALL BACK NUMBER REASON FOR CALL**this is important as we prioritize the call backs  YOU WILL RECEIVE A CALL BACK THE SAME DAY AS LONG AS YOU CALL BEFORE 4:00 PM  At the [facility], you and your health needs are our priority. As part of our continuing mission to provide you with exceptional heart care, we have created designated Provider Care Teams. These Care Teams include your primary Cardiologist (physician) and Advanced Practice Providers (APPs- Physician Assistants and Nurse Practitioners) who all work together to provide you with the care you need, when you need it.   You may see any of the following providers on your designated Care Team at your next follow up: Dr Arvilla Meres Dr Marca Ancona Dr. Dorthula Nettles Dr. Clearnce Hasten Amy Filbert Schilder, NP Robbie Lis, [place] [facility] [place], [place] Brynda Peon, NP [place] Lee, NP Karle Plumber, PharmD   Please be sure to bring in all your medications bottles to every appointment.    Thank you for choosing [facility]

## 2023-06-29 ENCOUNTER — Other Ambulatory Visit (HOSPITAL_COMMUNITY): Payer: Self-pay

## 2023-07-28 ENCOUNTER — Ambulatory Visit: Payer: Managed Care, Other (non HMO) | Attending: Cardiology | Admitting: Cardiology

## 2023-08-31 ENCOUNTER — Other Ambulatory Visit (HOSPITAL_COMMUNITY): Payer: Self-pay | Admitting: Cardiology

## 2023-09-09 ENCOUNTER — Encounter (HOSPITAL_COMMUNITY): Payer: Managed Care, Other (non HMO)

## 2023-09-14 IMAGING — MR MR CARD MORPHOLOGY WO/W CM
45 of 48 series · 45 of 48 positions shown · IV contrast (gadavist)
Comparison: none

CLINICAL DATA: 55F with HFrEF (EF 20-25% on echo [DATE]). Cath with
normal coronaries.

EXAM:
CARDIAC MRI
TECHNIQUE: The patient was scanned on a 1.5 Tesla Siemens magnet. A dedicated
cardiac coil was used. Functional imaging was done using Fiesta
sequences. [DATE], and 4 chamber views were done to assess for RWMA's.
Modified Laaouina rule using a short axis stack was used to
calculate an ejection fraction on a dedicated work station using
Circle software. The patient received 9 cc of Gadavist. After 10
minutes inversion recovery sequences were used to assess for
infiltration and scar tissue.
CONTRAST:  9 cc  of Gadavist

[Series 4: t2_haste_db_tra_bh · axial · 8.0mm · 1.41mm/px · 1 of 16 slices shown]
[im 1/16]
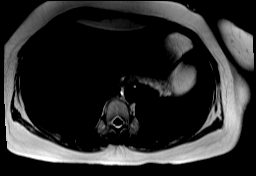

[Series 8: bSSFP · oblique · 8.0mm · 1.61mm/px · 1 of 25 slices shown (1 of 19)]
[im 1/25]
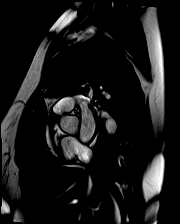

[Series 9: bSSFP · oblique · 8.0mm · 1.61mm/px · 1 of 25 slices shown (2 of 19)]
[im 1/25]
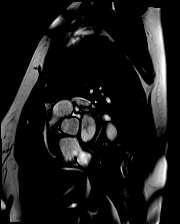

[Series 10: bSSFP · oblique · 8.0mm · 1.61mm/px · 1 of 25 slices shown (3 of 19)]
[im 1/25]
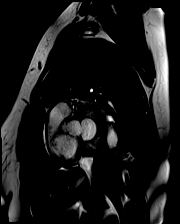

[Series 11: bSSFP · oblique · 8.0mm · 1.61mm/px · 1 of 25 slices shown (4 of 19)]
[im 1/25]
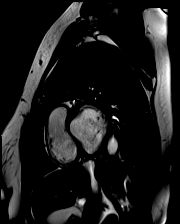

[Series 12: bSSFP · oblique · 8.0mm · 1.61mm/px · 1 of 25 slices shown (5 of 19)]
[im 1/25]
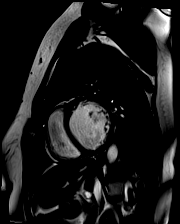

[Series 13: bSSFP · oblique · 8.0mm · 1.61mm/px · 1 of 25 slices shown (6 of 19)]
[im 1/25]
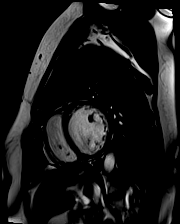

[Series 14: bSSFP · oblique · 8.0mm · 1.61mm/px · 1 of 25 slices shown (7 of 19)]
[im 1/25]
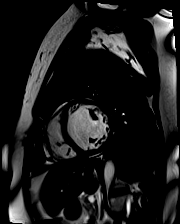

[Series 15: bSSFP · oblique · 8.0mm · 1.61mm/px · 1 of 25 slices shown (8 of 19)]
[im 1/25]
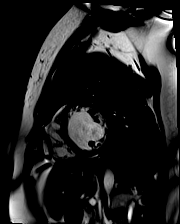

[Series 16: bSSFP · oblique · 8.0mm · 1.61mm/px · 1 of 25 slices shown (9 of 19)]
[im 1/25]
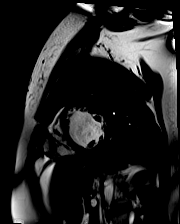

[Series 17: bSSFP · oblique · 8.0mm · 1.61mm/px · 1 of 25 slices shown (10 of 19)]
[im 1/25]
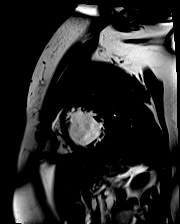

[Series 18: bSSFP · oblique · 8.0mm · 1.61mm/px · 1 of 25 slices shown (11 of 19)]
[im 1/25]
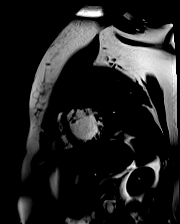

[Series 19: bSSFP · oblique · 8.0mm · 1.61mm/px · 1 of 25 slices shown (12 of 19)]
[im 1/25]
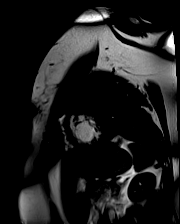

[Series 20: bSSFP · oblique · 8.0mm · 1.61mm/px · 1 of 25 slices shown (13 of 19)]
[im 1/25]
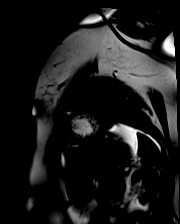

[Series 21: bSSFP · oblique · 8.0mm · 1.61mm/px · 1 of 25 slices shown (14 of 19)]
[im 1/25]
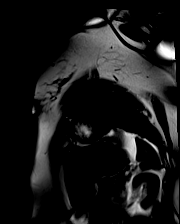

[Series 22: bSSFP · oblique · 8.0mm · 1.61mm/px · 1 of 25 slices shown (15 of 19)]
[im 1/25]
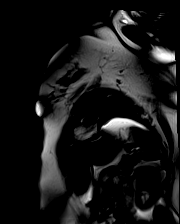

[Series 23: bSSFP · oblique · 8.0mm · 1.61mm/px · 1 of 25 slices shown (16 of 19)]
[im 1/25]
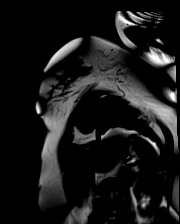

[Series 24: bSSFP · oblique · 6.0mm · 1.41mm/px · 1 of 25 slices shown (17 of 19)]
[im 1/25]
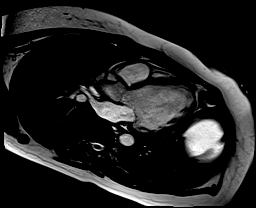

[Series 25: bSSFP · oblique · 6.0mm · 1.41mm/px · 1 of 25 slices shown (18 of 19)]
[im 1/25]
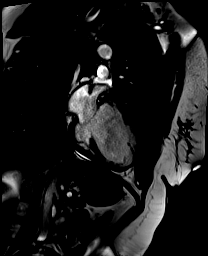

[Series 26: bSSFP · oblique · 6.0mm · 1.41mm/px · 1 of 25 slices shown (19 of 19)]
[im 1/25]
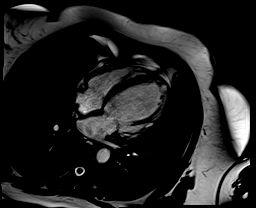

[Series 27: (id)_long_t1 · oblique · 8.0mm · 1.56mm/px · 1 of 24 slices shown]
[im 1/24]
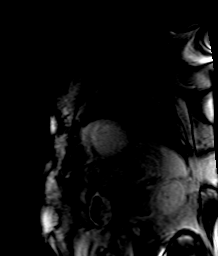

[Series 28: (id)_long_t1_moco · oblique · 8.0mm · 1.56mm/px · 1 of 24 slices shown]
[im 1/24]
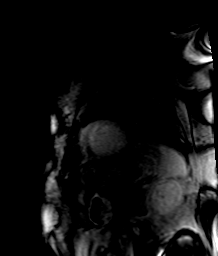

[Series 29: (id)_long_t1_moco_t1 · oblique · 8.0mm · 1.56mm/px · 1 of 3 slices shown]
[im 1/3]
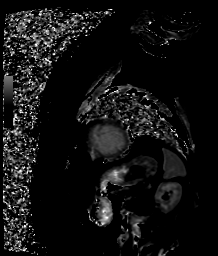

[Series 31: (id)_trufi · oblique · 8.0mm · 2.08mm/px · 1 of 9 slices shown]
[im 1/9]
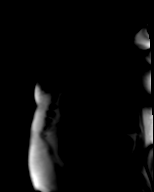

[Series 32: (id)_trufi_moco · oblique · 8.0mm · 2.08mm/px · 1 of 9 slices shown]
[im 1/9]
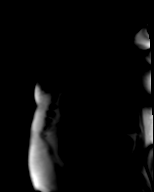

[Series 33: (id)_trufi_moco_t2 · oblique · 8.0mm · 2.08mm/px · 1 of 3 slices shown]
[im 1/3]
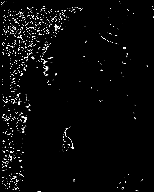

[Series 35: cine_trufi_short axis_cs_2_shot · oblique · 8.0mm · 1.48mm/px · 1 of 25 slices shown (1 of 15)]
[im 1/25]
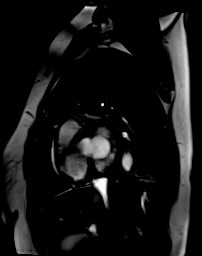

[Series 35: cine_trufi_short axis_cs_2_shot · oblique · 8.0mm · 1.48mm/px · 1 of 25 slices shown (2 of 15)]
[im 1/25]
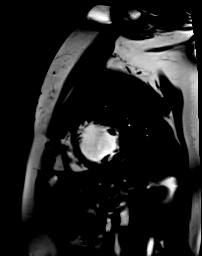

[Series 35: cine_trufi_short axis_cs_2_shot · oblique · 8.0mm · 1.48mm/px · 1 of 25 slices shown (3 of 15)]
[im 1/25]
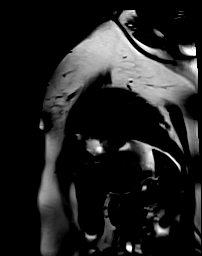

[Series 35: cine_trufi_short axis_cs_2_shot · oblique · 8.0mm · 1.48mm/px · 1 of 25 slices shown (4 of 15)]
[im 1/25]
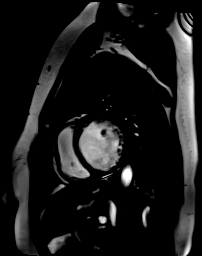

[Series 35: cine_trufi_short axis_cs_2_shot · oblique · 8.0mm · 1.48mm/px · 1 of 25 slices shown (5 of 15)]
[im 1/25]
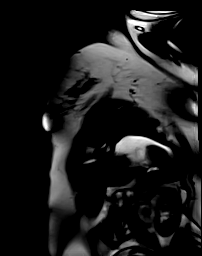

[Series 35: cine_trufi_short axis_cs_2_shot · oblique · 8.0mm · 1.48mm/px · 1 of 25 slices shown (6 of 15)]
[im 1/25]
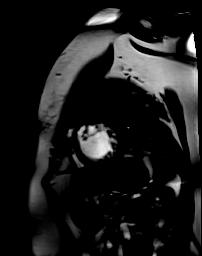

[Series 35: cine_trufi_short axis_cs_2_shot · oblique · 8.0mm · 1.48mm/px · 1 of 25 slices shown (7 of 15)]
[im 1/25]
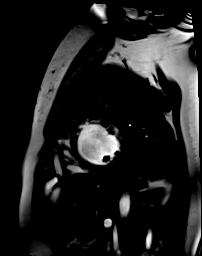

[Series 35: cine_trufi_short axis_cs_2_shot · oblique · 8.0mm · 1.48mm/px · 1 of 25 slices shown (8 of 15)]
[im 1/25]
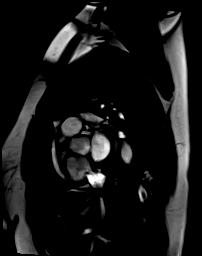

[Series 35: cine_trufi_short axis_cs_2_shot · oblique · 8.0mm · 1.48mm/px · 1 of 25 slices shown (9 of 15)]
[im 1/25]
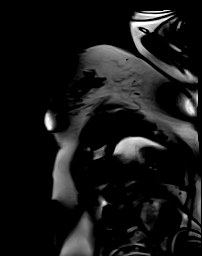

[Series 35: cine_trufi_short axis_cs_2_shot · oblique · 8.0mm · 1.48mm/px · 1 of 25 slices shown (10 of 15)]
[im 1/25]
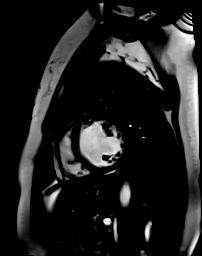

[Series 35: cine_trufi_short axis_cs_2_shot · oblique · 8.0mm · 1.48mm/px · 1 of 25 slices shown (11 of 15)]
[im 1/25]
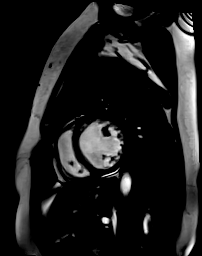

[Series 35: cine_trufi_short axis_cs_2_shot · oblique · 8.0mm · 1.48mm/px · 1 of 25 slices shown (12 of 15)]
[im 1/25]
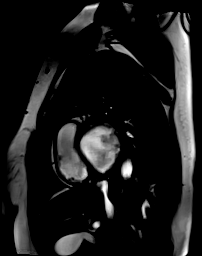

[Series 35: cine_trufi_short axis_cs_2_shot · oblique · 8.0mm · 1.48mm/px · 1 of 25 slices shown (13 of 15)]
[im 1/25]
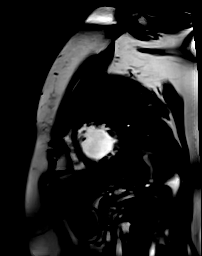

[Series 35: cine_trufi_short axis_cs_2_shot · oblique · 8.0mm · 1.48mm/px · 1 of 25 slices shown (14 of 15)]
[im 1/25]
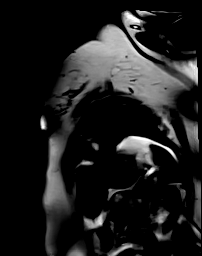

[Series 35: cine_trufi_short axis_cs_2_shot · oblique · 8.0mm · 1.48mm/px · 1 of 25 slices shown (15 of 15)]
[im 1/25]
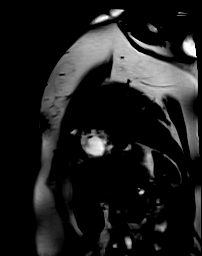

[Series 36: pre short axis · oblique · non-contrast · 8.0mm · 2.38mm/px · 1 of 10 slices shown (1 of 4)]
[im 1/10]
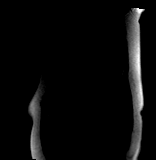

[Series 37: pre short axis · oblique · non-contrast · 8.0mm · 2.38mm/px · 1 of 10 slices shown (2 of 4)]
[im 1/10]
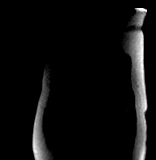

[Series 38: pre short axis · oblique · non-contrast · 8.0mm · 2.38mm/px · 1 of 10 slices shown (3 of 4)]
[im 1/10]
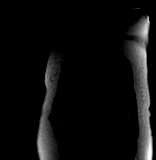

[Series 39: pre short axis · oblique · non-contrast · 8.0mm · 2.38mm/px · 1 of 10 slices shown (4 of 4)]
[im 1/10]
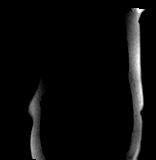

[45 of 48 positions shown; findings below may reference images not displayed]

FINDINGS: Left ventricle:

-Severe dilatation

-Moderate to severe systolic dysfunction. Septal dyskinesis
consistent with LBBB

-Normal ECV (26%)

-No LGE

LV EF:  30% (Normal 56-78%)

Absolute volumes:

LV EDV: 283mL (Normal 52-141 mL)

LV ESV: 197mL (Normal 13-51 mL)

LV SV: 86mL (Normal 33-97 mL)

CO: 6.7L/min (Normal 2.7-6.0 L/min)

Indexed volumes:

LV EDV: 148mL/sq-m (Normal 41-81 mL/sq-m)

LV ESV: 103mL/sq-m (Normal 12-21 mL/sq-m)

LV SV: 45mL/sq-m (Normal 26-56 mL/sq-m)

CI: 3.5L/min/sq-m (Normal 1.8-3.8 L/min/sq-m)

Right ventricle: Normal size and systolic function

RV EF: 53% (Normal 47-80%)

Absolute volumes:

RV EDV: 139mL (Normal 58-154 mL)

RV ESV: 66mL (Normal 12-68 mL)

RV SV: 74mL (Normal 35-98 mL)

CO: 5.7L/min (Normal 2.7-6 L/min)

Indexed volumes:

RV EDV: 73mL/sq-m (Normal 48-87 mL/sq-m)

RV ESV: 34mL/sq-m (Normal 11-28 mL/sq-m)

RV SV: 38mL/sq-m (Normal 27-57 mL/sq-m)

CI: 3.0L/min/sq-m (Normal 1.8-3.8 L/min/sq-m)

Left atrium: Mild enlargement

Right atrium: Normal size

Mitral valve: Mild to moderate regurgitation (regurgitant fraction
21%)

Aortic valve: Indeterminant number of cusps.  No regurgitation

Tricuspid valve: Trivial regurgitation

Pulmonic valve: No regurgitation

Aorta: Normal proximal ascending aorta

Pericardium: Normal
IMPRESSION: 1. Severe LV dilatation with moderate to severe systolic dysfunction
(EF 30%). Septal dyskinesis consistent with LBBB

2.  Normal RV size and systolic function (EF 53%)

3.  No late gadolinium enhancement to suggest myocardial scar

4.  Mild to moderate mitral regurgitation (regurgitant fraction 21%)

## 2023-09-24 NOTE — Progress Notes (Signed)
 PCP Georganna Skeans, MD HF Cardiologist: Dr. Shirlee Latch  HPI: 58 y.o. female with history of chronic systolic CHF, LBBB, GERD, GI bleed d/t gastric ulcer 06/21, obesity, OSA unable to tolerate CPAP. She is a Scientist, product/process development.   Admitted 3/23 with new systolic CHF. Sinus tachycardia noted with rates in 120s, apparently had been tachycardic for some time.  Echo: EF 20-25%, RV okay, mild MR. R/LHC: no CAD, CO 11/CI 6, RA 1, PCWP 7 mmHg, LVEDP 11.    Cardiac MRI 5/23: LV EF 30%, severe LV dilation, normal RV with EF 53%, no LGE, mild-moderate MR.    Echo in 9/23 EF 30-35%, normal RV  Echo 7/24 EF 30-35% with septal-lateral dyssynchrony consistent with LBBB, normal RV. She saw EP but was not ready to commit to CRT.   Here today for HF clinic follow-up.  She has been doing well. No chest pain, shortness of breath, orthopnea, PND or lower extremity edema. Home weight averages around 176 lb. Taking all medications as prescribed. Walks for 30-45 minutes regularly without limitation. No dizziness or syncope. 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 Unable to tolerate CPAP so she sent machine back.   No ETOH or tobacco use.  Review of Systems: All systems reviewed and negative except as per HPI.    Current Outpatient Medications  Medication Sig Dispense Refill   dapagliflozin propanediol (FARXIGA) 10 MG TABS tablet Take 1 tablet by mouth once daily 30 tablet 11   furosemide (LASIX) 20 MG tablet Take 1 tablet (20 mg total) by mouth daily as needed for edema or fluid. 30 tablet 6   metoprolol succinate (TOPROL-XL) 50 MG 24 hr tablet Take 2 tablets (100 mg total) by mouth in the morning AND 1 tablet (50 mg total) every evening. Take with or immediately following a meal.. 180 tablet 3   potassium chloride (KLOR-CON M) 10 MEQ tablet Take 1 tablet (10 mEq total) by mouth daily as needed. Take 1 tablet (10 mEq total) by mouth when taking furosemide. 30 tablet 6   sacubitril-valsartan (ENTRESTO) 97-103 MG Take 1  tablet by mouth 2 (two) times daily. 60 tablet 6   spironolactone (ALDACTONE) 25 MG tablet Take 1 tablet by mouth once daily 90 tablet 1   No current facility-administered medications for this encounter.    No Known Allergies    Social History   Socioeconomic History   Marital status: Married    Spouse name: Alecia Lemming   Number of children: 3   Years of education: Not on file   Highest education level: High school graduate  Occupational History   Occupation: housekeeper   Occupation: English as a second language teacher.  Tobacco Use   Smoking status: Never   Smokeless tobacco: Never  Vaping Use   Vaping status: Never Used  Substance and Sexual Activity   Alcohol use: Never   Drug use: Never   Sexual activity: Yes  Other Topics Concern   Not on file  Social History Narrative   Not on file   Social Drivers of Health   Financial Resource Strain: Medium Risk (04/01/2023)   Overall Financial Resource Strain (CARDIA)    Difficulty of Paying Living Expenses: Somewhat hard  Food Insecurity: Food Insecurity Present (10/11/2021)   Hunger Vital Sign    Worried About Running Out of Food in the Last Year: Sometimes true    Ran Out of Food in the Last Year: Never true  Transportation Needs: No Transportation Needs (10/11/2021)   PRAPARE - Transportation    Lack  of Transportation (Medical): No    Lack of Transportation (Non-Medical): No  Physical Activity: Not on file  Stress: Not on file  Social Connections: Unknown (12/03/2021)   Received from [facility]   Social Network    Social Network: Not on file  Intimate Partner Violence: Unknown (10/25/2021)   Received from [facility], [facility]   HITS    Physically Hurt: Not on file    Insult or Talk Down To: Not on file    Threaten Physical Harm: Not on file    Scream or Curse: Not on file      Family History  Problem Relation Age of Onset   Hypertension Father    BP 122/64   Pulse 84   Ht 5\' 4"  (1.626 m)   Wt 80.4 kg (177  lb 3.2 oz)   SpO2 97%   BMI 30.42 kg/m   Wt Readings from Last 3 Encounters:  09/25/23 80.4 kg (177 lb 3.2 oz)  06/09/23 79.5 kg (175 lb 3.2 oz)  03/16/23 79.7 kg (175 lb 12.8 oz)   PHYSICAL EXAM: General:  Well appearing.  Neck: no JVD.  Cor: Regular rate & rhythm. No rubs, gallops or murmurs. Lungs: clear Abdomen: soft, nontender, nondistended.  Extremities: no cyanosis, clubbing, rash, trace edema Neuro: alert & orientedx3. Affect pleasant    ASSESSMENT & PLAN: 1. Chronic systolic CHF:  - Nonischemic cardiomyopathy.  Has LBBB. - Echo 03/23: EF 20-25% with normal RV.  LHC showed no significant CAD.  - Cardiac MRI 5/23 LV EF 30%, severe LV dilation, normal RV with EF 53%, no LGE, mild-moderate MR.    - Echo 9/23 EF 30-35%, normal RV, IVC normal.   - Echo 7/24 EF 30-35%, normal RV.  - Etiology not certain. No CAD; ? LBBB cardiomyopathy though QRS is NOT markedly wide (< 150 ms on prior ECGs); no family hx CHF or alcohol/drug use.  - NYHA I-II. Volume looks good on exam. - She does not need a loop diuretic.  - Continue Toprol XL to 100 qam/50 qpm.  - Continue Farxiga 10 mg daily - Continue spironolactone 25 mg daily.  - Continue Entresto 97/103 bid. She brought in paperwork for [employer] patient assistance program. Will pass along to pharmacy team.  - BMET/BNP today - EF remains < 35%. She has seen EP, and though QRS is not markedly wide, she was offered CRT based on moderately wide LBBB with septal-lateral dyssynchrony on echo.  Discussed role of CRT again today. She declined.  - Repeat echo at next follow-up  2. OSA: Severe with AHI of 45 in 04/23 - Unable to tolerate CPAP.  - Not interested in retrial  3. LBBB: Chronic, noted on prior ECGs.  Not markedly wide with QRS 134 msec on most recent ECG.  However, prominent septal-lateral dyssynchrony and concern that she could have a LBBB cardiomyopathy.  - As above, has seen EP and offered CRT. Continues to  decline.  Follow-up: 6 months with APP with same day echo  [facility], Rayder Sullenger N  09/25/2023

## 2023-09-25 ENCOUNTER — Other Ambulatory Visit (HOSPITAL_COMMUNITY): Payer: Self-pay

## 2023-09-25 ENCOUNTER — Ambulatory Visit (HOSPITAL_COMMUNITY)
Admission: RE | Admit: 2023-09-25 | Discharge: 2023-09-25 | Disposition: A | Discharge status: Home / Self Care | Payer: Self-pay | Source: Ambulatory Visit | Attending: Physician Assistant | Admitting: Physician Assistant

## 2023-09-25 ENCOUNTER — Encounter (HOSPITAL_COMMUNITY): Payer: Self-pay

## 2023-09-25 VITALS — BP 122/64 | HR 84 | Ht 64.0 in | Wt 177.2 lb

## 2023-09-25 DIAGNOSIS — I447 Left bundle-branch block, unspecified: Secondary | ICD-10-CM | POA: Insufficient documentation

## 2023-09-25 DIAGNOSIS — I428 Other cardiomyopathies: Secondary | ICD-10-CM | POA: Insufficient documentation

## 2023-09-25 DIAGNOSIS — Z5986 Financial insecurity: Secondary | ICD-10-CM | POA: Diagnosis not present

## 2023-09-25 DIAGNOSIS — Z8249 Family history of ischemic heart disease and other diseases of the circulatory system: Secondary | ICD-10-CM | POA: Insufficient documentation

## 2023-09-25 DIAGNOSIS — Z8711 Personal history of peptic ulcer disease: Secondary | ICD-10-CM | POA: Diagnosis not present

## 2023-09-25 DIAGNOSIS — G4733 Obstructive sleep apnea (adult) (pediatric): Secondary | ICD-10-CM | POA: Diagnosis not present

## 2023-09-25 DIAGNOSIS — I5022 Chronic systolic (congestive) heart failure: Secondary | ICD-10-CM | POA: Diagnosis present

## 2023-09-25 DIAGNOSIS — Z79899 Other long term (current) drug therapy: Secondary | ICD-10-CM | POA: Insufficient documentation

## 2023-09-25 LAB — BRAIN NATRIURETIC PEPTIDE: B Natriuretic Peptide: 19.6 pg/mL (ref 0.0–100.0)

## 2023-09-25 LAB — BASIC METABOLIC PANEL
Anion gap: 10 (ref 5–15)
BUN: 13 mg/dL (ref 6–20)
CO2: 25 mmol/L (ref 22–32)
Calcium: 9.5 mg/dL (ref 8.9–10.3)
Chloride: 105 mmol/L (ref 98–111)
Creatinine, Ser: 0.63 mg/dL (ref 0.44–1.00)
GFR, Estimated: >60 mL/min (ref >60)
Glucose, Bld: 102 mg/dL — ABNORMAL HIGH (ref 70–99)
Potassium: 3.8 mmol/L (ref 3.5–5.1)
Sodium: 140 mmol/L (ref 135–145)

## 2023-09-25 MED ORDER — SACUBITRIL-VALSARTAN 97-103 MG PO TABS: 1.0000 | ORAL_TABLET | Freq: Two times a day (BID) | ORAL | 3 refills | Status: DC

## 2023-09-25 NOTE — Patient Instructions (Addendum)
 Thank you for coming in today  If you had labs drawn today, any labs that are abnormal the clinic will call you No news is good news  Medications: No changes  Follow up appointments:  Your physician recommends that you schedule a follow-up appointment in:  6 months with echocardiogram in clinic  Your physician has requested that you have an echocardiogram. Echocardiography is a painless test that uses sound waves to create images of your heart. It provides your doctor with information about the size and shape of your heart and how well your heart's chambers and valves are working. This procedure takes approximately one hour. There are no restrictions for this procedure.      Do the following things EVERYDAY: Weigh yourself in the morning before breakfast. Write it down and keep it in a log. Take your medicines as prescribed Eat low salt foods--Limit salt (sodium) to 2000 mg per day.  Stay as active as you can everyday Limit all fluids for the day to less than 2 liters   At the [facility], you and your health needs are our priority. As part of our continuing mission to provide you with exceptional heart care, we have created designated Provider Care Teams. These Care Teams include your primary Cardiologist (physician) and Advanced Practice Providers (APPs- Physician Assistants and Nurse Practitioners) who all work together to provide you with the care you need, when you need it.   You may see any of the following providers on your designated Care Team at your next follow up: Dr Arvilla Meres Dr Marca Ancona Dr. Marcos Eke, NP Robbie Lis, [place] [facility] [place], [place] Brynda Peon, NP Karle Plumber, PharmD   Please be sure to bring in all your medications bottles to every appointment.    Thank you for choosing [facility]  If you have any questions or concerns before your next  appointment please send [place] a message through [place] or call our office at [phone].    TO LEAVE A MESSAGE FOR THE NURSE SELECT OPTION 2, PLEASE LEAVE A MESSAGE INCLUDING: YOUR NAME DATE OF BIRTH CALL BACK NUMBER REASON FOR CALL**this is important as we prioritize the call backs  YOU WILL RECEIVE A CALL BACK THE SAME DAY AS LONG AS YOU CALL BEFORE 4:00 PM

## 2023-09-25 NOTE — Addendum Note (Signed)
 Encounter addended by: Demetrius Charity, RN on: 09/25/2023 11:32 AM  Actions taken: Order list changed

## 2023-11-29 ENCOUNTER — Other Ambulatory Visit (HOSPITAL_COMMUNITY): Payer: Self-pay | Admitting: Cardiology

## 2024-02-29 ENCOUNTER — Other Ambulatory Visit (HOSPITAL_COMMUNITY): Payer: Self-pay | Admitting: Cardiology

## 2024-03-25 ENCOUNTER — Telehealth (HOSPITAL_COMMUNITY): Payer: Self-pay

## 2024-03-25 NOTE — Telephone Encounter (Signed)
 Called to confirm/remind patient of their appointment at the [facility] on 03/28/24.   Appointment:   [] Confirmed  [] Left mess   [] No answer/No voice mail  [x] VM Full/unable to leave message  [] Phone not in service

## 2024-03-25 NOTE — Progress Notes (Incomplete)
 PCP [name], MD HF Cardiologist: Dr. Rolan  HPI: 58 y.o. female with history of chronic systolic CHF, LBBB, GERD, GI bleed d/t gastric ulcer 06/21, obesity, OSA unable to tolerate CPAP. She is a Scientist, product/process development.   Admitted 3/23 with new systolic CHF. Sinus tachycardia noted with rates in 120s, apparently had been tachycardic for some time.  Echo: EF 20-25%, RV okay, mild MR. R/LHC: no CAD, CO 11/CI 6, RA 1, PCWP 7 mmHg, LVEDP 11.    Cardiac MRI 5/23: LV EF 30%, severe LV dilation, normal RV with EF 53%, no LGE, mild-moderate MR.    Echo in 9/23 EF 30-35%, normal RV  Echo 7/24 EF 30-35% with septal-lateral dyssynchrony consistent with LBBB, normal RV. She saw EP but was not ready to commit to CRT.   Here today for HF clinic follow-up.  She has been doing well. No chest pain, shortness of breath, orthopnea, PND or lower extremity edema. Home weight averages around 176 lb. Taking all medications as prescribed. Walks for 30-45 minutes regularly without limitation. No dizziness or syncope. 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 Unable to tolerate CPAP so she sent machine back.   No ETOH or tobacco use.  Review of Systems: All systems reviewed and negative except as per HPI.    Current Outpatient Medications  Medication Sig Dispense Refill   FARXIGA  10 MG TABS tablet Take 1 tablet by mouth once daily 90 tablet 1   furosemide  (LASIX ) 20 MG tablet Take 1 tablet (20 mg total) by mouth daily as needed for edema or fluid. 30 tablet 6   metoprolol  succinate (TOPROL -XL) 50 MG 24 hr tablet TAKE 2 TABLETS BY MOUTH IN THE MORNING AND 1 IN THE EVENING. TAKE WITH OR IMMEDIATELY FOLLOWING A MEAL 270 tablet 0   potassium chloride  (KLOR-CON  M) 10 MEQ tablet Take 1 tablet (10 mEq total) by mouth daily as needed. Take 1 tablet (10 mEq total) by mouth when taking furosemide . 30 tablet 6   sacubitril -valsartan  (ENTRESTO ) 97-103 MG Take 1 tablet by mouth 2 (two) times daily. 180 tablet 3    spironolactone  (ALDACTONE ) 25 MG tablet Take 1 tablet by mouth once daily 90 tablet 1   No current facility-administered medications for this visit.    No Known Allergies    Social History   Socioeconomic History   Marital status: Married    Spouse name: [name]   Number of children: 3   Years of education: Not on file   Highest education level: High school graduate  Occupational History   Occupation: housekeeper   Occupation: English as a second language teacher.  Tobacco Use   Smoking status: Never   Smokeless tobacco: Never  Vaping Use   Vaping status: Never Used  Substance and Sexual Activity   Alcohol use: Never   Drug use: Never   Sexual activity: Yes  Other Topics Concern   Not on file  Social History Narrative   Not on file   Social Drivers of Health   Financial Resource Strain: Medium Risk (04/01/2023)   Overall Financial Resource Strain (CARDIA)    Difficulty of Paying Living Expenses: Somewhat hard  Food Insecurity: Food Insecurity Present (10/11/2021)   Hunger Vital Sign    Worried About Running Out of Food in the Last Year: Sometimes true    Ran Out of Food in the Last Year: Never true  Transportation Needs: No Transportation Needs (10/11/2021)   PRAPARE - Transportation    Lack of Transportation (Medical): No    Lack  of Transportation (Non-Medical): No  Physical Activity: Not on file  Stress: Not on file  Social Connections: Unknown (12/03/2021)   Received from [facility]   Social Network    Social Network: Not on file  Intimate Partner Violence: Unknown (10/25/2021)   Received from [facility]   HITS    Physically Hurt: Not on file    Insult or Talk Down To: Not on file    Threaten Physical Harm: Not on file    Scream or Curse: Not on file      Family History  Problem Relation Age of Onset   Hypertension Father    There were no vitals taken for this visit.  Wt Readings from Last 3 Encounters:  09/25/23 80.4 kg (177 lb 3.2 oz)  06/09/23 79.5 kg (175 lb 3.2  oz)  03/16/23 79.7 kg (175 lb 12.8 oz)   PHYSICAL EXAM: General:  Well appearing.  Neck: no JVD.  Cor: Regular rate & rhythm. No rubs, gallops or murmurs. Lungs: clear Abdomen: soft, nontender, nondistended.  Extremities: no cyanosis, clubbing, rash, trace edema Neuro: alert & orientedx3. Affect pleasant    ASSESSMENT & PLAN: 1. Chronic systolic CHF:  - Nonischemic cardiomyopathy.  Has LBBB. - Echo 03/23: EF 20-25% with normal RV.  LHC showed no significant CAD.  - Cardiac MRI 5/23 LV EF 30%, severe LV dilation, normal RV with EF 53%, no LGE, mild-moderate MR.    - Echo 9/23 EF 30-35%, normal RV, IVC normal.   - Echo 7/24 EF 30-35%, normal RV.  - Etiology not certain. No CAD; ? LBBB cardiomyopathy though QRS is NOT markedly wide (< 150 ms on prior ECGs); no family hx CHF or alcohol/drug use.  - NYHA I-II. Volume looks good on exam. - She does not need a loop diuretic.  - Continue Toprol  XL to 100 qam/50 qpm.  - Continue Farxiga  10 mg daily - Continue spironolactone  25 mg daily.  - Continue Entresto  97/103 bid. She brought in paperwork for [employer] patient assistance program. Will pass along to pharmacy team.  - BMET/BNP today - EF remains < 35%. She has seen EP, and though QRS is not markedly wide, she was offered CRT based on moderately wide LBBB with septal-lateral dyssynchrony on echo.  Discussed role of CRT again today. She declined.  - Repeat echo at next follow-up  2. OSA: Severe with AHI of 45 in 04/23 - Unable to tolerate CPAP.  - Not interested in retrial  3. LBBB: Chronic, noted on prior ECGs.  Not markedly wide with QRS 134 msec on most recent ECG.  However, prominent septal-lateral dyssynchrony and concern that she could have a LBBB cardiomyopathy.  - As above, has seen EP and offered CRT. Continues to decline.  Follow-up: 6 months with APP with same day echo  Harlene HERO [facility]  03/25/2024

## 2024-03-28 ENCOUNTER — Ambulatory Visit (HOSPITAL_COMMUNITY): Admission: RE | Admit: 2024-03-28 | Source: Ambulatory Visit

## 2024-03-28 ENCOUNTER — Encounter (HOSPITAL_COMMUNITY)

## 2024-04-26 ENCOUNTER — Other Ambulatory Visit (HOSPITAL_COMMUNITY): Payer: Self-pay

## 2024-04-26 ENCOUNTER — Ambulatory Visit (HOSPITAL_BASED_OUTPATIENT_CLINIC_OR_DEPARTMENT_OTHER)
Admission: RE | Admit: 2024-04-26 | Discharge: 2024-04-26 | Disposition: A | Discharge status: Home / Self Care | Source: Ambulatory Visit | Attending: Physician Assistant | Admitting: Physician Assistant

## 2024-04-26 ENCOUNTER — Encounter (HOSPITAL_COMMUNITY): Payer: Self-pay

## 2024-04-26 ENCOUNTER — Ambulatory Visit (HOSPITAL_COMMUNITY): Payer: Self-pay | Admitting: Cardiology

## 2024-04-26 ENCOUNTER — Ambulatory Visit (HOSPITAL_COMMUNITY)
Admission: RE | Admit: 2024-04-26 | Discharge: 2024-04-26 | Disposition: A | Discharge status: Home / Self Care | Source: Ambulatory Visit | Attending: Cardiology | Admitting: Cardiology

## 2024-04-26 ENCOUNTER — Ambulatory Visit (HOSPITAL_COMMUNITY): Payer: Self-pay | Admitting: Physician Assistant

## 2024-04-26 VITALS — BP 122/78 | HR 64 | Ht 64.0 in | Wt 177.2 lb

## 2024-04-26 DIAGNOSIS — I447 Left bundle-branch block, unspecified: Secondary | ICD-10-CM

## 2024-04-26 DIAGNOSIS — I428 Other cardiomyopathies: Secondary | ICD-10-CM

## 2024-04-26 DIAGNOSIS — Z6830 Body mass index (BMI) 30.0-30.9, adult: Secondary | ICD-10-CM | POA: Insufficient documentation

## 2024-04-26 DIAGNOSIS — Z79899 Other long term (current) drug therapy: Secondary | ICD-10-CM | POA: Insufficient documentation

## 2024-04-26 DIAGNOSIS — Z8711 Personal history of peptic ulcer disease: Secondary | ICD-10-CM | POA: Insufficient documentation

## 2024-04-26 DIAGNOSIS — I517 Cardiomegaly: Secondary | ICD-10-CM | POA: Diagnosis not present

## 2024-04-26 DIAGNOSIS — G4733 Obstructive sleep apnea (adult) (pediatric): Secondary | ICD-10-CM | POA: Diagnosis not present

## 2024-04-26 DIAGNOSIS — I5022 Chronic systolic (congestive) heart failure: Secondary | ICD-10-CM | POA: Diagnosis not present

## 2024-04-26 DIAGNOSIS — E669 Obesity, unspecified: Secondary | ICD-10-CM | POA: Diagnosis not present

## 2024-04-26 DIAGNOSIS — Z7984 Long term (current) use of oral hypoglycemic drugs: Secondary | ICD-10-CM | POA: Diagnosis not present

## 2024-04-26 LAB — BASIC METABOLIC PANEL WITH GFR
Anion gap: 9 (ref 5–15)
BUN: 11 mg/dL (ref 6–20)
CO2: 26 mmol/L (ref 22–32)
Calcium: 9.5 mg/dL (ref 8.9–10.3)
Chloride: 106 mmol/L (ref 98–111)
Creatinine, Ser: 0.71 mg/dL (ref 0.44–1.00)
GFR, Estimated: >60 mL/min (ref >60)
Glucose, Bld: 103 mg/dL — ABNORMAL HIGH (ref 70–99)
Potassium: 4.2 mmol/L (ref 3.5–5.1)
Sodium: 141 mmol/L (ref 135–145)

## 2024-04-26 LAB — ECHOCARDIOGRAM COMPLETE
Area-P 1/2: 3.51 cm2
S' Lateral: 4.4 cm

## 2024-04-26 LAB — BRAIN NATRIURETIC PEPTIDE: B Natriuretic Peptide: 11.7 pg/mL (ref 0.0–100.0)

## 2024-04-26 MED ORDER — FARXIGA 10 MG PO TABS: 10.0000 mg | ORAL_TABLET | Freq: Every day | ORAL | 3 refills | Status: AC

## 2024-04-26 NOTE — Progress Notes (Addendum)
 PCP Lori Bleacher, MD HF Cardiologist: Dr. Rolan  HPI: 58 y.o. female with history of chronic systolic CHF, LBBB, GERD, GI bleed d/t gastric ulcer 06/21, obesity, OSA unable to tolerate CPAP. She is a Scientist, product/process development.   Admitted 3/23 with new systolic CHF. Sinus tachycardia noted with rates in 120s, apparently had been tachycardic for some time.  Echo: EF 20-25%, RV okay, mild MR. R/LHC: no CAD, CO 11/CI 6, RA 1, PCWP 7 mmHg, LVEDP 11.    Cardiac MRI 5/23: LV EF 30%, severe LV dilation, normal RV with EF 53%, no LGE, mild-moderate MR.    Echo in 9/23 EF 30-35%, normal RV  Echo 7/24 EF 30-35% with septal-lateral dyssynchrony consistent with LBBB, normal RV. She saw EP but was not ready to commit to CRT.   Here today for HF clinic follow-up. She is accompanied by her significant other who assists with the history. No dyspnea, orthopnea, PND or lower extremity edema. 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 Weight is stable. Taking all medications but states farxiga  recently cost more at pharmacy, believes copay card tried was expired. Unable to tolerate CPAP so she sent machine back.   No ETOH or tobacco use.  Review of Systems: All systems reviewed and negative except as per HPI.   PMH: 1. Upper GI bleeding: Gastric ulcer 6/21.  2. LBBB 3. OSA: Severe on sleep study.  4. Chronic systolic CHF: Nonischemic cardiomyopathy.   - Echo (3/23): EF 20-25%, normal RV, mild MR.  - RHC/LHC (3/23): No significant CAD; RA 1, mean PCWP 7, CI 6 - Cardiac MRI (5/23): LV EF 30%, severe LV dilation, normal RV with EF 53%, no LGE, mild-moderate MR.  - Echo (9/23): EF 30-35%, normal RV, IVC normal. - Echo (7/24): EF 30-35% with septal-lateral dyssynchrony consistent with LBBB, normal RV    Current Outpatient Medications  Medication Sig Dispense Refill   furosemide  (LASIX ) 20 MG tablet Take 1 tablet (20 mg total) by mouth daily as  needed for edema or fluid. 30 tablet 6   metoprolol  succinate (TOPROL -XL) 50 MG 24 hr tablet TAKE 2 TABLETS BY MOUTH IN THE MORNING AND 1 IN THE EVENING. TAKE WITH OR IMMEDIATELY FOLLOWING A MEAL 270 tablet 0   potassium chloride  (KLOR-CON  M) 10 MEQ tablet Take 1 tablet (10 mEq total) by mouth daily as needed. Take 1 tablet (10 mEq total) by mouth when taking furosemide . 30 tablet 6   sacubitril -valsartan  (ENTRESTO ) 97-103 MG Take 1 tablet by mouth 2 (two) times daily. 180 tablet 3   spironolactone  (ALDACTONE ) 25 MG tablet Take 1 tablet by mouth once daily 90 tablet 1   FARXIGA  10 MG TABS tablet Take 1 tablet (10 mg total) by mouth daily. 90 tablet 3   No current facility-administered medications for this encounter.    No Known Allergies    Social History   Socioeconomic History   Marital status: Married    Spouse name: Lori Mcbride   Number of children: 3   Years of education: Not on file   Highest education level: High school graduate  Occupational History   Occupation: housekeeper   Occupation: English as a second language teacher.  Tobacco Use   Smoking status: Never   Smokeless tobacco: Never  Vaping Use   Vaping status: Never Used  Substance and Sexual Activity   Alcohol use: Never   Drug use: Never   Sexual activity: Yes  Other Topics Concern  Not on file  Social History Narrative   Not on file   Social Drivers of Health   Financial Resource Strain: Medium Risk (04/01/2023)   Overall Financial Resource Strain (CARDIA)    Difficulty of Paying Living Expenses: Somewhat hard  Food Insecurity: Food Insecurity Present (10/11/2021)   Hunger Vital Sign    Worried About Running Out of Food in the Last Year: Sometimes true    Ran Out of Food in the Last Year: Never true  Transportation Needs: No Transportation Needs (10/11/2021)   PRAPARE - Administrator, Civil Service (Medical): No    Lack of Transportation (Non-Medical): No  Physical Activity: Not on file  Stress: Not on file   Social Connections: Unknown (12/03/2021)   Received from [facility]   Social Network    Social Network: Not on file  Intimate Partner Violence: Unknown (10/25/2021)   Received from [facility]   HITS    Physically Hurt: Not on file    Insult or Talk Down To: Not on file    Threaten Physical Harm: Not on file    Scream or Curse: Not on file      Family History  Problem Relation Age of Onset   Hypertension Father    BP 122/78   Pulse 64   Ht 5' 4 (1.626 m)   Wt 80.4 kg (177 lb 3.2 oz)   SpO2 98%   BMI 30.42 kg/m   Wt Readings from Last 3 Encounters:  04/26/24 80.4 kg (177 lb 3.2 oz)  09/25/23 80.4 kg (177 lb 3.2 oz)  06/09/23 79.5 kg (175 lb 3.2 oz)   PHYSICAL EXAM: General:  Well appearing.  Neck: no JVD Cor: Regular rate & rhythm. No murmurs. Lungs: clear Abdomen: soft, nontender, nondistended.  Extremities: no edema Neuro: alert & orientedx3. Affect pleasant   ECG: SR 64 bpm, LBBB with QRS 138 ms  ASSESSMENT & PLAN: 1. Chronic systolic CHF:  - Nonischemic cardiomyopathy.  Has LBBB. - Echo 03/23: EF 20-25% with normal RV.  LHC showed no significant CAD.  - Cardiac MRI 5/23 LV EF 30%, severe LV dilation, normal RV with EF 53%, no LGE, mild-moderate MR.    - Echo 9/23 EF 30-35%, normal RV, IVC normal.   - Echo 7/24 EF 30-35%, normal RV.  - Etiology not certain. No CAD; ? LBBB cardiomyopathy though QRS is NOT markedly wide (< 150 ms on prior ECGs); no family hx CHF or alcohol/drug use.  - NYHA I-II. Volume looks good on exam.   - Does not need a loop diuretic. - Continue Toprol  XL 100 q am/50 q pm. - Continue Farxiga  10 mg daily, assisted with obtaining new copay card - Continue spiro 25 mg daily - Continue Entresto  97/103 mg BID - EF appears < 35% on echo today, personally reviewed. Awaiting formal read. She has seen EP, and though QRS is not markedly wide, she was offered CRT based on moderately wide LBBB with septal-lateral dyssynchrony on echo.   Discussed role of CRT again today. Continues to decline.  2. OSA: Severe with AHI of 45 in 04/23 - Unable to tolerate CPAP.  - Does not want to retrial.  3. LBBB: Chronic, noted on prior ECGs.  Not markedly wide with QRS 134 msec on most recent ECG.  However, prominent septal-lateral dyssynchrony and concern that she could have a LBBB cardiomyopathy.  - As above, has seen EP and offered CRT. Continues to decline as above.  Follow-up 4  months with Dr. Rolan  [facility], Lori Mcbride  04/26/2024

## 2024-04-26 NOTE — Patient Instructions (Signed)
 Medication Changes:  REFILLED Farxiga   Lab Work:  Labs done today, your results will be available in MyChart, we will contact you for abnormal readings.    Follow-Up in: Please follow up with the [facility] in 4 months with Dr. Mclean. We do not have that schedule at the moment. If you have not heard from the [facility] by January 2026, please give us  a call in order to schedule your appointment for February 2026.  At the [facility], you and your health needs are our priority. We have a designated team specialized in the treatment of Heart Failure. This Care Team includes your primary Heart Failure Specialized Cardiologist (physician), Advanced Practice Providers (APPs- Physician Assistants and Nurse Practitioners), and Pharmacist who all work together to provide you with the care you need, when you need it.   You may see any of the following providers on your designated Care Team at your next follow up:  Dr. Toribio Fuel Dr. Ezra Shuck Dr. Ria Commander Dr. Odis Brownie Greig Mosses, NP Caffie Shed, [place] [facility] [place], [place] Beckey Coe, NP [place] Lee, NP Tinnie Redman, PharmD   Please be sure to bring in all your medications bottles to every appointment.   Need to Contact Us :  If you have any questions or concerns before your next appointment please send us  a message through [place] or call our office at [phone].    TO LEAVE A MESSAGE FOR THE NURSE SELECT OPTION 2, PLEASE LEAVE A MESSAGE INCLUDING: YOUR NAME DATE OF BIRTH CALL BACK NUMBER REASON FOR CALL**this is important as we prioritize the call backs  YOU WILL RECEIVE A CALL BACK THE SAME DAY AS LONG AS YOU CALL BEFORE 4:00 PM

## 2024-04-26 NOTE — Addendum Note (Signed)
 Encounter addended by: Colletta Manuelita Garre, PA-C on: 04/26/2024 4:40 PM  Actions taken: Clinical Note Signed

## 2024-05-10 ENCOUNTER — Other Ambulatory Visit (HOSPITAL_COMMUNITY): Payer: Self-pay

## 2024-05-10 ENCOUNTER — Telehealth (HOSPITAL_COMMUNITY): Payer: Self-pay | Admitting: Pharmacist

## 2024-05-10 MED ORDER — SACUBITRIL-VALSARTAN 97-103 MG PO TABS
1.0000 | ORAL_TABLET | Freq: Two times a day (BID) | ORAL | 3 refills | Status: DC
  Filled 2024-05-11: qty 180, 90d supply, fill #0
  Filled 2024-05-11: qty 60, 30d supply, fill #0

## 2024-05-10 NOTE — Telephone Encounter (Signed)
 Patient called to relay cost change of Entresto . Test claim showed Entresto  generic is now required by insurance and copay is $68. Copay cards do not work with generic and brand Entresto  is no longer covered. Patient will attempt to get new insurance on marketplace this upcoming year.

## 2024-05-11 ENCOUNTER — Telehealth (HOSPITAL_COMMUNITY): Payer: Self-pay

## 2024-05-11 ENCOUNTER — Other Ambulatory Visit (HOSPITAL_COMMUNITY): Payer: Self-pay

## 2024-05-11 ENCOUNTER — Encounter (HOSPITAL_COMMUNITY): Payer: Self-pay | Admitting: Pharmacist

## 2024-05-11 MED ORDER — SACUBITRIL-VALSARTAN 97-103 MG PO TABS: 1.0000 | ORAL_TABLET | Freq: Two times a day (BID) | ORAL | 3 refills | Status: AC

## 2024-05-11 NOTE — Telephone Encounter (Signed)
 Advanced Heart Failure Patient Advocate Encounter  Insured pricing varies by location and by strength of Entresto . Spoke with spouse by phone about pricing and ability to use Good rx cards. New rx was sent to [facility] in [place], contacted pharmacy who confirmed $54 for 30 day supply of generic Entresto . Pt is okay with this pricing, refills will remain with [facility].  Rachel DEL, CPhT Rx Patient Advocate Phone: [phone]

## 2024-05-24 ENCOUNTER — Other Ambulatory Visit (HOSPITAL_COMMUNITY): Payer: Self-pay | Admitting: Cardiology

## 2024-05-26 ENCOUNTER — Other Ambulatory Visit (HOSPITAL_COMMUNITY): Payer: Self-pay | Admitting: Cardiology

## 2024-06-30 ENCOUNTER — Other Ambulatory Visit: Payer: Self-pay

## 2024-07-18 ENCOUNTER — Other Ambulatory Visit (HOSPITAL_COMMUNITY): Payer: Self-pay | Admitting: Cardiology
